# Patient Record
Sex: Female | Born: 1970 | Race: White | Hispanic: No | Smoking: Former smoker
Health system: Southern US, Community
[De-identification: ages and names within clinical notes are randomized; demographics above are authoritative.]

## PROBLEM LIST (undated history)

## (undated) DIAGNOSIS — K219 Gastro-esophageal reflux disease without esophagitis: Secondary | ICD-10-CM

## (undated) DIAGNOSIS — I429 Cardiomyopathy, unspecified: Secondary | ICD-10-CM

## (undated) DIAGNOSIS — M797 Fibromyalgia: Secondary | ICD-10-CM

## (undated) DIAGNOSIS — J449 Chronic obstructive pulmonary disease, unspecified: Secondary | ICD-10-CM

## (undated) DIAGNOSIS — G43909 Migraine, unspecified, not intractable, without status migrainosus: Secondary | ICD-10-CM

## (undated) DIAGNOSIS — M1990 Unspecified osteoarthritis, unspecified site: Secondary | ICD-10-CM

## (undated) DIAGNOSIS — C801 Malignant (primary) neoplasm, unspecified: Secondary | ICD-10-CM

## (undated) DIAGNOSIS — I10 Essential (primary) hypertension: Secondary | ICD-10-CM

## (undated) DIAGNOSIS — I340 Nonrheumatic mitral (valve) insufficiency: Secondary | ICD-10-CM

---

## 2005-04-01 ENCOUNTER — Ambulatory Visit: Payer: Self-pay | Admitting: Internal Medicine

## 2005-04-10 ENCOUNTER — Other Ambulatory Visit: Payer: Self-pay

## 2005-04-10 ENCOUNTER — Emergency Department: Payer: Self-pay | Admitting: Emergency Medicine

## 2005-05-29 ENCOUNTER — Ambulatory Visit: Payer: Self-pay | Admitting: Internal Medicine

## 2005-09-11 ENCOUNTER — Ambulatory Visit: Payer: Self-pay | Admitting: Unknown Physician Specialty

## 2005-09-15 ENCOUNTER — Ambulatory Visit: Payer: Self-pay | Admitting: Unknown Physician Specialty

## 2006-03-03 ENCOUNTER — Emergency Department: Payer: Self-pay | Admitting: Emergency Medicine

## 2016-09-15 DIAGNOSIS — R0602 Shortness of breath: Secondary | ICD-10-CM | POA: Insufficient documentation

## 2016-09-15 DIAGNOSIS — G4730 Sleep apnea, unspecified: Secondary | ICD-10-CM | POA: Insufficient documentation

## 2016-09-15 DIAGNOSIS — R079 Chest pain, unspecified: Secondary | ICD-10-CM | POA: Insufficient documentation

## 2016-09-15 DIAGNOSIS — I340 Nonrheumatic mitral (valve) insufficiency: Secondary | ICD-10-CM | POA: Insufficient documentation

## 2016-09-16 ENCOUNTER — Other Ambulatory Visit: Payer: Self-pay | Admitting: Physician Assistant

## 2016-09-16 DIAGNOSIS — R079 Chest pain, unspecified: Secondary | ICD-10-CM

## 2016-09-24 ENCOUNTER — Encounter
Admission: RE | Admit: 2016-09-24 | Discharge: 2016-09-24 | Disposition: A | Discharge status: Home / Self Care | Payer: Medicaid Other | Source: Ambulatory Visit | Attending: Physician Assistant | Admitting: Physician Assistant

## 2016-09-24 ENCOUNTER — Other Ambulatory Visit
Admission: RE | Admit: 2016-09-24 | Discharge: 2016-09-24 | Disposition: A | Discharge status: Home / Self Care | Payer: Medicaid Other | Source: Ambulatory Visit | Attending: Cardiology | Admitting: Cardiology

## 2016-09-24 ENCOUNTER — Ambulatory Visit
Admission: RE | Admit: 2016-09-24 | Discharge: 2016-09-24 | Disposition: A | Discharge status: Home / Self Care | Payer: Medicaid Other | Source: Ambulatory Visit | Attending: Cardiology | Admitting: Cardiology

## 2016-09-24 DIAGNOSIS — R079 Chest pain, unspecified: Secondary | ICD-10-CM | POA: Insufficient documentation

## 2016-09-24 DIAGNOSIS — R0602 Shortness of breath: Secondary | ICD-10-CM | POA: Insufficient documentation

## 2016-09-24 DIAGNOSIS — I083 Combined rheumatic disorders of mitral, aortic and tricuspid valves: Secondary | ICD-10-CM | POA: Diagnosis not present

## 2016-09-24 LAB — ECHOCARDIOGRAM COMPLETE
HEIGHTINCHES: 68 in
Weight: 6064 oz

## 2016-09-24 LAB — PREGNANCY, URINE: Preg Test, Ur: NEGATIVE

## 2016-09-24 MED ORDER — REGADENOSON 0.4 MG/5ML IV SOLN
0.4000 mg | Freq: Once | INTRAVENOUS | Status: AC
  Administered 2016-09-24: 0.4 mg via INTRAVENOUS

## 2016-09-24 MED ORDER — TECHNETIUM TC 99M TETROFOSMIN IV KIT
30.0000 | PACK | Freq: Once | INTRAVENOUS | Status: AC | PRN
  Administered 2016-09-24: 31.992 via INTRAVENOUS

## 2016-09-24 NOTE — Progress Notes (Signed)
*  PRELIMINARY RESULTS* Echocardiogram 2D Echocardiogram has been performed.  Paula Gilmore 09/24/2016, 11:54 AM

## 2016-09-25 ENCOUNTER — Encounter
Admission: RE | Admit: 2016-09-25 | Discharge: 2016-09-25 | Disposition: A | Discharge status: Home / Self Care | Payer: Medicaid Other | Source: Ambulatory Visit | Attending: Physician Assistant | Admitting: Physician Assistant

## 2016-09-25 DIAGNOSIS — R079 Chest pain, unspecified: Secondary | ICD-10-CM | POA: Diagnosis not present

## 2016-09-25 MED ORDER — TECHNETIUM TC 99M TETROFOSMIN IV KIT
30.0000 | PACK | Freq: Once | INTRAVENOUS | Status: AC | PRN
  Administered 2016-09-25: 30.862 via INTRAVENOUS

## 2016-11-14 LAB — NM MYOCAR MULTI W/SPECT W/WALL MOTION / EF
CHL CUP NUCLEAR SDS: 2
CHL CUP NUCLEAR SSS: 11
CHL CUP RESTING HR STRESS: 66 {beats}/min
CSEPED: 1 min
CSEPHR: 52 %
Estimated workload: 1 METS
Exercise duration (sec): 0 s
LV dias vol: 234 mL (ref 46–106)
LV sys vol: 127 mL
MPHR: 174 {beats}/min
Peak HR: 92 {beats}/min
SRS: 10
TID: 1.21

## 2016-12-17 ENCOUNTER — Other Ambulatory Visit: Payer: Self-pay | Admitting: Family Medicine

## 2016-12-17 DIAGNOSIS — R0602 Shortness of breath: Secondary | ICD-10-CM

## 2016-12-17 DIAGNOSIS — J189 Pneumonia, unspecified organism: Secondary | ICD-10-CM

## 2017-01-07 ENCOUNTER — Ambulatory Visit: Payer: Medicaid Other | Admitting: Nurse Practitioner

## 2017-01-14 ENCOUNTER — Other Ambulatory Visit: Payer: Self-pay

## 2017-01-14 ENCOUNTER — Ambulatory Visit
Admission: RE | Admit: 2017-01-14 | Discharge: 2017-01-14 | Disposition: A | Discharge status: Home / Self Care | Payer: Medicaid Other | Source: Ambulatory Visit | Attending: Family Medicine | Admitting: Family Medicine

## 2017-01-14 ENCOUNTER — Ambulatory Visit: Payer: Medicaid Other | Attending: Nurse Practitioner | Admitting: Nurse Practitioner

## 2017-01-14 ENCOUNTER — Encounter: Payer: Self-pay | Admitting: Nurse Practitioner

## 2017-01-14 ENCOUNTER — Other Ambulatory Visit
Admission: RE | Admit: 2017-01-14 | Discharge: 2017-01-14 | Disposition: A | Discharge status: Home / Self Care | Payer: Medicaid Other | Source: Ambulatory Visit | Attending: Nurse Practitioner | Admitting: Nurse Practitioner

## 2017-01-14 ENCOUNTER — Ambulatory Visit
Admission: RE | Admit: 2017-01-14 | Discharge: 2017-01-14 | Disposition: A | Discharge status: Home / Self Care | Payer: Medicaid Other | Source: Ambulatory Visit | Attending: Nurse Practitioner | Admitting: Nurse Practitioner

## 2017-01-14 VITALS — BP 157/112 | HR 88 | Resp 16 | Ht 68.0 in | Wt 362.0 lb

## 2017-01-14 DIAGNOSIS — M25561 Pain in right knee: Secondary | ICD-10-CM

## 2017-01-14 DIAGNOSIS — R0602 Shortness of breath: Secondary | ICD-10-CM

## 2017-01-14 DIAGNOSIS — M25512 Pain in left shoulder: Secondary | ICD-10-CM | POA: Insufficient documentation

## 2017-01-14 DIAGNOSIS — M25562 Pain in left knee: Secondary | ICD-10-CM | POA: Insufficient documentation

## 2017-01-14 DIAGNOSIS — M5441 Lumbago with sciatica, right side: Secondary | ICD-10-CM

## 2017-01-14 DIAGNOSIS — R918 Other nonspecific abnormal finding of lung field: Secondary | ICD-10-CM | POA: Insufficient documentation

## 2017-01-14 DIAGNOSIS — M79603 Pain in arm, unspecified: Secondary | ICD-10-CM | POA: Insufficient documentation

## 2017-01-14 DIAGNOSIS — G894 Chronic pain syndrome: Secondary | ICD-10-CM | POA: Insufficient documentation

## 2017-01-14 DIAGNOSIS — M797 Fibromyalgia: Secondary | ICD-10-CM | POA: Insufficient documentation

## 2017-01-14 DIAGNOSIS — G8929 Other chronic pain: Secondary | ICD-10-CM | POA: Insufficient documentation

## 2017-01-14 DIAGNOSIS — M542 Cervicalgia: Secondary | ICD-10-CM | POA: Insufficient documentation

## 2017-01-14 DIAGNOSIS — Z789 Other specified health status: Secondary | ICD-10-CM | POA: Insufficient documentation

## 2017-01-14 DIAGNOSIS — J189 Pneumonia, unspecified organism: Secondary | ICD-10-CM | POA: Insufficient documentation

## 2017-01-14 DIAGNOSIS — M79604 Pain in right leg: Secondary | ICD-10-CM | POA: Insufficient documentation

## 2017-01-14 DIAGNOSIS — R51 Headache: Secondary | ICD-10-CM

## 2017-01-14 DIAGNOSIS — M79602 Pain in left arm: Secondary | ICD-10-CM | POA: Insufficient documentation

## 2017-01-14 DIAGNOSIS — M25511 Pain in right shoulder: Secondary | ICD-10-CM | POA: Insufficient documentation

## 2017-01-14 DIAGNOSIS — M546 Pain in thoracic spine: Secondary | ICD-10-CM | POA: Diagnosis not present

## 2017-01-14 DIAGNOSIS — M79606 Pain in leg, unspecified: Secondary | ICD-10-CM | POA: Insufficient documentation

## 2017-01-14 DIAGNOSIS — M779 Enthesopathy, unspecified: Secondary | ICD-10-CM | POA: Diagnosis not present

## 2017-01-14 DIAGNOSIS — M545 Low back pain, unspecified: Secondary | ICD-10-CM | POA: Insufficient documentation

## 2017-01-14 DIAGNOSIS — Z79899 Other long term (current) drug therapy: Secondary | ICD-10-CM | POA: Diagnosis not present

## 2017-01-14 DIAGNOSIS — M79605 Pain in left leg: Secondary | ICD-10-CM | POA: Diagnosis not present

## 2017-01-14 DIAGNOSIS — R519 Headache, unspecified: Secondary | ICD-10-CM | POA: Insufficient documentation

## 2017-01-14 DIAGNOSIS — M79601 Pain in right arm: Secondary | ICD-10-CM | POA: Diagnosis not present

## 2017-01-14 DIAGNOSIS — M5442 Lumbago with sciatica, left side: Secondary | ICD-10-CM

## 2017-01-14 DIAGNOSIS — M899 Disorder of bone, unspecified: Secondary | ICD-10-CM

## 2017-01-14 DIAGNOSIS — M170 Bilateral primary osteoarthritis of knee: Secondary | ICD-10-CM | POA: Diagnosis not present

## 2017-01-14 DIAGNOSIS — R609 Edema, unspecified: Secondary | ICD-10-CM | POA: Insufficient documentation

## 2017-01-14 LAB — COMPREHENSIVE METABOLIC PANEL
ALBUMIN: 4.3 g/dL (ref 3.5–5.0)
ALT: 14 U/L (ref 14–54)
ANION GAP: 9 (ref 5–15)
AST: 16 U/L (ref 15–41)
Alkaline Phosphatase: 71 U/L (ref 38–126)
BILIRUBIN TOTAL: 0.6 mg/dL (ref 0.3–1.2)
BUN: 18 mg/dL (ref 6–20)
CO2: 23 mmol/L (ref 22–32)
Calcium: 9.5 mg/dL (ref 8.9–10.3)
Chloride: 105 mmol/L (ref 101–111)
Creatinine, Ser: 0.82 mg/dL (ref 0.44–1.00)
GFR calc Af Amer: >60 mL/min (ref >60)
GLUCOSE: 101 mg/dL — AB (ref 65–99)
POTASSIUM: 4 mmol/L (ref 3.5–5.1)
Sodium: 137 mmol/L (ref 135–145)
TOTAL PROTEIN: 7.8 g/dL (ref 6.5–8.1)

## 2017-01-14 LAB — VITAMIN B12: VITAMIN B 12: 521 pg/mL (ref 180–914)

## 2017-01-14 LAB — SEDIMENTATION RATE: SED RATE: 10 mm/h (ref 0–20)

## 2017-01-14 LAB — MAGNESIUM: Magnesium: 2 mg/dL (ref 1.7–2.4)

## 2017-01-14 LAB — C-REACTIVE PROTEIN: CRP: 1.2 mg/dL — ABNORMAL HIGH (ref <1.0)

## 2017-01-14 NOTE — Progress Notes (Signed)
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    ____________________________________________________________________________________________  Appointment Policy Summary  It is our goal and responsibility to provide the medical community with assistance in the evaluation and management of patients with chronic pain. Unfortunately our resources are limited. Because we do not have an unlimited amount of time, or available appointments, we are required to closely monitor and manage their use. The following rules exist to maximize their use:  Patient's responsibilities: 1. Punctuality:  At what time should I arrive? You should be physically present in our office 30 minutes before your scheduled appointment. Your scheduled appointment is with your assigned healthcare provider. However, it takes 5-10 minutes to be "checked-in", and another 15 minutes for the nurses to do the admission. If you arrive to our office at the time you were given for your appointment, you will end up being at least 20-25 minutes late to your appointment with the provider. 2. Tardiness:  What happens if I arrive only a few minutes after my scheduled appointment  time? You will need to reschedule your appointment. The cutoff is your appointment time. This is why it is so important that you arrive at least 30 minutes before that appointment. If you have an appointment scheduled for 10:00 AM and you arrive at 10:01, you will be required to reschedule your appointment.  3. Plan ahead:  Always assume that you will encounter traffic on your way in. Plan for it. If you are dependent on a driver, make sure they understand these rules and the need to arrive early. 4. Other appointments and responsibilities:  Avoid scheduling any other appointments before or after your pain clinic appointments.  5. Be prepared:  Write down everything that you need to discuss with your healthcare provider and give this information to the admitting nurse. Write down the medications that you will need refilled. Bring your pills and bottles (even the empty ones), to all of your appointments, except for those where a procedure is scheduled. 6. No children or pets:  Find someone to take care of them. It is not appropriate to bring them in. 7. Scheduling  changes:  We request "advanced notification" of any changes or cancellations. 8. Advanced notification:  Defined as a time period of more than 24 hours prior to the originally scheduled appointment. This allows for the appointment to be offered to other patients. 9. Rescheduling:  When a visit is rescheduled, it will require the cancellation of the original appointment. For this reason they both fall within the category of "Cancellations".  10. Cancellations:  They require advanced notification. Any cancellation less than 24 hours before the  appointment will be recorded as a "No Show". 11. No Show:  Defined as an unkept appointment where the patient failed to notify or declare to the practice their intention or inability to keep the appointment.  Corrective process for repeat offenders:  1. Tardiness: Three (3) episodes of rescheduling  due to late arrivals will be recorded as one (1) "No Show". 2. Cancellation or reschedule: Three (3) cancellations or rescheduling will be recorded as one (1) "No Show". 3. "No Shows": Three (3) "No Shows" within a 12 month period will result in discharge from the practice.  ____________________________________________________________________________________________   ____________________________________________________________________________________________  Pain Scale  Introduction: The pain score used by this practice is the Verbal Numerical Rating Scale (VNRS-11). This is an 11-point scale. It is for adults and children 10 years or older. There are significant differences in how the pain score is reported, used, and applied. Forget everything you learned in the past and learn this scoring system.  General Information: The scale should reflect your current level of pain. Unless you are specifically asked for the level of your worst pain, or your average pain. If you are asked for one of these two, then it should be understood that it is over the past 24 hours.  Basic Activities of Daily Living (ADL): Personal hygiene, dressing, eating, transferring, and using restroom.  Instructions: Most patients tend to report their level of pain as a combination of two factors, their physical pain and their psychosocial pain. This last one is also known as "suffering" and it is reflection of how physical pain affects you socially and psychologically. From now on, report them separately. From this point on, when asked to report your pain level, report only your physical pain. Use the following table for reference.  Pain Clinic Pain Levels (0-5/10)  Pain Level Score  Description  No Pain 0   Mild pain 1 Nagging, annoying, but does not interfere with basic activities of daily living (ADL). Patients are able to eat, bathe, get dressed, toileting (being able to get on and off the toilet and perform personal  hygiene functions), transfer (move in and out of bed or a chair without assistance), and maintain continence (able to control bladder and bowel functions). Blood pressure and heart rate are unaffected. A normal heart rate for a healthy adult ranges from 60 to 100 bpm (beats per minute).   Mild to moderate pain 2 Noticeable and distracting. Impossible to hide from other people. More frequent flare-ups. Still possible to adapt and function close to normal. It can be very annoying and may have occasional stronger flare-ups. With discipline, patients may get used to it and adapt.   Moderate pain 3 Interferes significantly with activities of daily living (ADL). It becomes difficult to feed, bathe, get dressed, get on and off the toilet or to perform personal hygiene functions. Difficult to get in and out of bed or a chair without assistance. Very distracting. With effort, it can be ignored when deeply involved in  activities.   Moderately severe pain 4 Impossible to ignore for more than a few minutes. With effort, patients may still be able to manage work or participate in some social activities. Very difficult to concentrate. Signs of autonomic nervous system discharge are evident: dilated pupils (mydriasis); mild sweating (diaphoresis); sleep interference. Heart rate becomes elevated (>115 bpm). Diastolic blood pressure (lower number) rises above 100 mmHg. Patients find relief in laying down and not moving.   Severe pain 5 Intense and extremely unpleasant. Associated with frowning face and frequent crying. Pain overwhelms the senses.  Ability to do any activity or maintain social relationships becomes significantly limited. Conversation becomes difficult. Pacing back and forth is common, as getting into a comfortable position is nearly impossible. Pain wakes you up from deep sleep. Physical signs will be obvious: pupillary dilation; increased sweating; goosebumps; brisk reflexes; cold, clammy hands and feet;  nausea, vomiting or dry heaves; loss of appetite; significant sleep disturbance with inability to fall asleep or to remain asleep. When persistent, significant weight loss is observed due to the complete loss of appetite and sleep deprivation.  Blood pressure and heart rate becomes significantly elevated. Caution: If elevated blood pressure triggers a pounding headache associated with blurred vision, then the patient should immediately seek attention at an urgent or emergency care unit, as these may be signs of an impending stroke.    Emergency Department Pain Levels (6-10/10)  Emergency Room Pain 6 Severely limiting. Requires emergency care and should not be seen or managed at an outpatient pain management facility. Communication becomes difficult and requires great effort. Assistance to reach the emergency department may be required. Facial flushing and profuse sweating along with potentially dangerous increases in heart rate and blood pressure will be evident.   Distressing pain 7 Self-care is very difficult. Assistance is required to transport, or use restroom. Assistance to reach the emergency department will be required. Tasks requiring coordination, such as bathing and getting dressed become very difficult.   Disabling pain 8 Self-care is no longer possible. At this level, pain is disabling. The individual is unable to do even the most "basic" activities such as walking, eating, bathing, dressing, transferring to a bed, or toileting. Fine motor skills are lost. It is difficult to think clearly.   Incapacitating pain 9 Pain becomes incapacitating. Thought processing is no longer possible. Difficult to remember your own name. Control of movement and coordination are lost.   The worst pain imaginable 10 At this level, most patients pass out from pain. When this level is reached, collapse of the autonomic nervous system occurs, leading to a sudden drop in blood pressure and heart rate. This in turn  results in a temporary and dramatic drop in blood flow to the brain, leading to a loss of consciousness. Fainting is one of the body's self defense mechanisms. Passing out puts the brain in a calmed state and causes it to shut down for a while, in order to begin the healing process.    Summary: 1. Refer to this scale when providing [place] with your pain level. 2. Be accurate and careful when reporting your pain level. This will help with your care. 3. Over-reporting your pain level will lead to loss of credibility. 4. Even a level of 1/10 means that there is pain and will be treated at our facility. 5. High, inaccurate reporting will be documented as "Symptom Exaggeration", leading to loss of credibility and suspicions of possible secondary gains such as obtaining more narcotics, or wanting  to appear disabled, for fraudulent reasons. 6. Only pain levels of 5 or below will be seen at our facility. 7. Pain levels of 6 and above will be sent to the Emergency Department and the appointment cancelled. ____________________________________________________________________________________________

## 2017-01-14 NOTE — Patient Instructions (Signed)

## 2017-01-14 NOTE — Progress Notes (Signed)
Safety precautions to be maintained throughout the outpatient stay will include: orient to surroundings, keep bed in low position, maintain call bell within reach at all times, provide assistance with transfer out of bed and ambulation.  

## 2017-01-15 LAB — VITAMIN D 25 HYDROXY (VIT D DEFICIENCY, FRACTURES): VIT D 25 HYDROXY: 13.2 ng/mL — AB (ref 30.0–100.0)

## 2017-01-22 LAB — COMPLIANCE DRUG ANALYSIS, UR

## 2017-01-23 ENCOUNTER — Other Ambulatory Visit: Payer: Self-pay

## 2017-01-28 NOTE — Progress Notes (Signed)
Results were reviewed and found to be: mildly abnormal  No acute injury or pathology identified  Review would suggest interventional pain management techniques may be of benefit 

## 2017-01-28 NOTE — Progress Notes (Signed)
Results were reviewed and found to be: abnormal  Further testing may be useful  Review would suggest interventional pain management techniques may be of benefit

## 2017-08-21 ENCOUNTER — Other Ambulatory Visit: Payer: Self-pay | Admitting: Obstetrics & Gynecology

## 2017-08-21 DIAGNOSIS — R102 Pelvic and perineal pain: Secondary | ICD-10-CM

## 2017-08-21 DIAGNOSIS — N23 Unspecified renal colic: Secondary | ICD-10-CM

## 2017-09-02 ENCOUNTER — Ambulatory Visit: Admission: RE | Admit: 2017-09-02 | Payer: Medicaid Other | Source: Ambulatory Visit

## 2018-10-08 ENCOUNTER — Emergency Department: Payer: Medicaid Other

## 2018-10-08 ENCOUNTER — Emergency Department
Admission: EM | Admit: 2018-10-08 | Discharge: 2018-10-08 | Disposition: A | Discharge status: Home / Self Care | Payer: Medicaid Other | Attending: Emergency Medicine | Admitting: Emergency Medicine

## 2018-10-08 ENCOUNTER — Other Ambulatory Visit: Payer: Self-pay

## 2018-10-08 DIAGNOSIS — F17200 Nicotine dependence, unspecified, uncomplicated: Secondary | ICD-10-CM | POA: Diagnosis not present

## 2018-10-08 DIAGNOSIS — R05 Cough: Secondary | ICD-10-CM | POA: Diagnosis present

## 2018-10-08 DIAGNOSIS — Z79899 Other long term (current) drug therapy: Secondary | ICD-10-CM | POA: Diagnosis not present

## 2018-10-08 DIAGNOSIS — I10 Essential (primary) hypertension: Secondary | ICD-10-CM | POA: Insufficient documentation

## 2018-10-08 DIAGNOSIS — J449 Chronic obstructive pulmonary disease, unspecified: Secondary | ICD-10-CM | POA: Diagnosis not present

## 2018-10-08 DIAGNOSIS — F410 Panic disorder [episodic paroxysmal anxiety] without agoraphobia: Secondary | ICD-10-CM | POA: Insufficient documentation

## 2018-10-08 DIAGNOSIS — R110 Nausea: Secondary | ICD-10-CM

## 2018-10-08 DIAGNOSIS — J069 Acute upper respiratory infection, unspecified: Secondary | ICD-10-CM | POA: Insufficient documentation

## 2018-10-08 DIAGNOSIS — Z20828 Contact with and (suspected) exposure to other viral communicable diseases: Secondary | ICD-10-CM | POA: Insufficient documentation

## 2018-10-08 HISTORY — DX: Essential (primary) hypertension: I10

## 2018-10-08 LAB — GLUCOSE, CAPILLARY: Glucose-Capillary: 130 mg/dL — ABNORMAL HIGH (ref 70–99)

## 2018-10-08 LAB — URINALYSIS, COMPLETE (UACMP) WITH MICROSCOPIC
Bacteria, UA: NONE SEEN
Bilirubin Urine: NEGATIVE
Glucose, UA: NEGATIVE mg/dL
Ketones, ur: NEGATIVE mg/dL
Leukocytes,Ua: NEGATIVE
Nitrite: NEGATIVE
Protein, ur: NEGATIVE mg/dL
RBC / HPF: >50 RBC/hpf — ABNORMAL HIGH (ref 0–5)
Specific Gravity, Urine: 1.014 (ref 1.005–1.030)
pH: 5 (ref 5.0–8.0)

## 2018-10-08 LAB — CBC
HCT: 44.5 % (ref 36.0–46.0)
Hemoglobin: 14.6 g/dL (ref 12.0–15.0)
MCH: 28.9 pg (ref 26.0–34.0)
MCHC: 32.8 g/dL (ref 30.0–36.0)
MCV: 88.1 fL (ref 80.0–100.0)
Platelets: 273 10*3/uL (ref 150–400)
RBC: 5.05 MIL/uL (ref 3.87–5.11)
RDW: 13.9 % (ref 11.5–15.5)
WBC: 12.9 10*3/uL — ABNORMAL HIGH (ref 4.0–10.5)
nRBC: 0 % (ref 0.0–0.2)

## 2018-10-08 LAB — BASIC METABOLIC PANEL
Anion gap: 11 (ref 5–15)
BUN: 13 mg/dL (ref 6–20)
CO2: 23 mmol/L (ref 22–32)
Calcium: 10.1 mg/dL (ref 8.9–10.3)
Chloride: 102 mmol/L (ref 98–111)
Creatinine, Ser: 0.58 mg/dL (ref 0.44–1.00)
GFR calc Af Amer: >60 mL/min (ref >60)
GFR calc non Af Amer: >60 mL/min (ref >60)
Glucose, Bld: 129 mg/dL — ABNORMAL HIGH (ref 70–99)
Potassium: 3.7 mmol/L (ref 3.5–5.1)
Sodium: 136 mmol/L (ref 135–145)

## 2018-10-08 LAB — POCT PREGNANCY, URINE: Preg Test, Ur: NEGATIVE

## 2018-10-08 MED ORDER — ONDANSETRON 4 MG PO TBDP
ORAL_TABLET | ORAL | Status: DC
Start: 2018-10-08 — End: 2018-10-08
  Filled 2018-10-08: qty 1

## 2018-10-08 MED ORDER — ONDANSETRON 4 MG PO TBDP
4.0000 mg | ORAL_TABLET | Freq: Once | ORAL | Status: AC
  Administered 2018-10-08: 18:00:00 4 mg via ORAL

## 2018-10-08 MED ORDER — PREDNISONE 20 MG PO TABS: 40.0000 mg | ORAL_TABLET | Freq: Every day | ORAL | 0 refills | Status: DC

## 2018-10-08 MED ORDER — ONDANSETRON HCL 4 MG/2ML IJ SOLN: 4.0000 mg | Freq: Once | INTRAMUSCULAR | Status: DC

## 2018-10-08 MED ORDER — PREDNISONE 20 MG PO TABS
40.0000 mg | ORAL_TABLET | Freq: Once | ORAL | Status: AC
  Administered 2018-10-08: 40 mg via ORAL
  Filled 2018-10-08: qty 2

## 2018-10-08 MED ORDER — ONDANSETRON 4 MG PO TBDP: 4.0000 mg | ORAL_TABLET | Freq: Four times a day (QID) | ORAL | 0 refills | Status: DC | PRN

## 2018-10-08 MED ORDER — ALUM & MAG HYDROXIDE-SIMETH 200-200-20 MG/5ML PO SUSP
15.0000 mL | Freq: Once | ORAL | Status: AC
  Administered 2018-10-08: 15 mL via ORAL
  Filled 2018-10-08: qty 30

## 2018-10-08 NOTE — ED Notes (Signed)
Pt refuses discharge vitals due to needing to leave

## 2018-10-08 NOTE — ED Notes (Addendum)
Pt frantic in lobby about a "ride to get to her appointments". Pt making phone calls and sobbing. Pt stating she cannot breath. Oxygen stats at 100%. Stating driver called her "a killer." Son attempting to console pt.

## 2018-10-08 NOTE — ED Provider Notes (Signed)
[facility] Emergency Department Provider Note   ____________________________________________   First MD Initiated Contact with Patient 10/08/18 1657     (approximate)  I have reviewed the triage vital signs and the nursing notes.   HISTORY  Chief Complaint Nausea and Dizziness    HPI Paula Gilmore is a 48 y.o. female is a history of recently diagnosed COPD  Patient reports she has been having a cough that started initially with nasal congestion a few weeks ago, she is also had a dry nonproductive cough for a couple weeks.  She does have COPD and was treated with steroids about 10 days ago, she took for a few days seem to get better except she is had an ongoing cough.  She reports she called her doctor Dr. Raul Del today, he advised her to come for a COVID test.  She reports that she got on the transport van and will in route here she start developing nausea, felt her stomach was upset, then started to feel hot, she got here and was not feeling too good, reports she had a "panic attack" and came to the emergency room to be evaluated because she could not go to the doctor's office for her test at that point  She continues to endorse an ongoing dry cough.  Occasionally using her inhaler.  She states she will use it once or twice a day.  No fevers or chills.  Some nausea but no vomiting.  Reports her stomach felt very upset like she developed "gastritis" but starting to feel better now   Past Medical History:  Diagnosis Date  . Hypertension     Patient Active Problem List   Diagnosis Date Noted  . Headache (Primary Area of Pain) 01/14/2017  . Chronic low back pain (Secondary Area of Pain) (B) (R>L) 01/14/2017  . Chronic pain of lower extremity [facility] Area of Pain) (Bilateral) (R>L) 01/14/2017  . Bilateral chronic knee pain (Fourth Area of Pain) (Bilateral) (R>L) 01/14/2017  . Chronic upper extremity pain 01/14/2017  . Chronic shoulder pain (B) (R>L)  01/14/2017  . Chronic neck pain (midline) 01/14/2017  . Disorder of bone, unspecified 01/14/2017  . Other long term (current) drug therapy 01/14/2017  . Other specified health status 01/14/2017  . Chronic pain syndrome 01/14/2017  . Chest pain with high risk for cardiac etiology 09/15/2016  . Mitral valve regurgitation 09/15/2016  . Shortness of breath 09/15/2016  . Sleep apnea 09/15/2016    History reviewed. No pertinent surgical history.  Prior to Admission medications   Medication Sig Start Date End Date Taking? Authorizing Provider  acetaminophen (TYLENOL) 325 MG tablet Take 1,000 mg by mouth every 6 (six) hours.    [provider]  Aspirin-Acetaminophen-Caffeine (GOODYS EXTRA STRENGTH) [id number] MG PACK Take 1 packet by mouth as needed.    [provider]  hydrochlorothiazide (HYDRODIURIL) 25 MG tablet Take 25 mg by mouth daily.    [provider]  ibuprofen (ADVIL,MOTRIN) 200 MG tablet Take 800 mg by mouth every 6 (six) hours.    [provider]  loperamide (IMODIUM) 2 MG capsule Take 2 mg by mouth as needed.    [provider]  methocarbamol (ROBAXIN) 750 MG tablet Take 750 mg by mouth 4 (four) times daily.    [provider]  naproxen (NAPROSYN) 250 MG tablet Take 250 mg by mouth 2 (two) times daily.    [provider]  ondansetron (ZOFRAN ODT) 4 MG disintegrating tablet Take 1 tablet (4  mg total) by mouth every 6 (six) hours as needed for nausea or vomiting. 10/08/18   Delman Kitten, MD  predniSONE (DELTASONE) 20 MG tablet Take 2 tablets (40 mg total) by mouth daily with breakfast. 10/08/18   Delman Kitten, MD  SUCRALFATE PO Take 1 tablet by mouth 4 (four) times daily.    [provider]    Allergies Cymbalta [duloxetine hcl], Montelukast, Tizanidine, and Savella [milnacipran hcl]  No family history on file.  Social History Social History   Tobacco Use  . Smoking status: Current Some Day Smoker  .  Smokeless tobacco: Never Used  Substance Use Topics  . Alcohol use: No    Frequency: Never  . Drug use: No    Review of Systems Constitutional: No fever/chills Eyes: No visual changes. ENT: No sore throat. Cardiovascular: Denies chest pain. Respiratory: Denies shortness of breath.  See HPI, dry cough Gastrointestinal: No abdominal pain.   Genitourinary: Negative for dysuria.  Denies pregnancy.  Reports her IUD fell out recently and she is going to follow-up with the doctor on that Musculoskeletal: Negative for back pain. Skin: Negative for rash. Neurological: Negative for headaches, areas of focal weakness or numbness.    ____________________________________________   PHYSICAL EXAM:  VITAL SIGNS: ED Triage Vitals  Enc Vitals Group     BP 10/08/18 1308 (!) 146/84     Pulse Rate 10/08/18 1308 88     Resp 10/08/18 1308 18     Temp 10/08/18 1308 98.1 F (36.7 C)     Temp Source 10/08/18 1308 Oral     SpO2 10/08/18 1308 94 %     Weight 10/08/18 1308 (!) 350 lb (158.8 kg)     Height 10/08/18 1308 5\' 8"  (1.727 m)     Head Circumference --      Peak Flow --      Pain Score 10/08/18 1311 7     Pain Loc --      Pain Edu? --      Excl. in [place]? --     Constitutional: Alert and oriented. Well appearing and in no acute distress.  Patient has morbid obesity.   Eyes: Conjunctivae are normal. Head: Atraumatic. Nose: Slight clear rhinorrhea Mouth/Throat: Mucous membranes are moist. Neck: No stridor.  Cardiovascular: Normal rate, regular rhythm. Grossly normal heart sounds.  Good peripheral circulation. Respiratory: Normal respiratory effort.  No retractions. Lungs CTAB.  Patient exhibits an occasional dry cough. Gastrointestinal: Soft and nontender. No distention. Musculoskeletal: No lower extremity tenderness nor edema. Neurologic:  Normal speech and language. No gross focal neurologic deficits are appreciated.  Skin:  Skin is warm, dry and intact. No rash noted. Psychiatric:  Mood and affect are normal. Speech and behavior are normal.  ____________________________________________   LABS (all labs ordered are listed, but only abnormal results are displayed)  Labs Reviewed  BASIC METABOLIC PANEL - Abnormal; Notable for the following components:      Result Value   Glucose, Bld 129 (*)    All other components within normal limits  CBC - Abnormal; Notable for the following components:   WBC 12.9 (*)    All other components within normal limits  URINALYSIS, COMPLETE (UACMP) WITH MICROSCOPIC - Abnormal; Notable for the following components:   Color, Urine YELLOW (*)    APPearance CLEAR (*)    Hgb urine dipstick LARGE (*)    RBC / HPF >50 (*)    All other components within normal limits  GLUCOSE, CAPILLARY - Abnormal; Notable  for the following components:   Glucose-Capillary 130 (*)    All other components within normal limits  SARS CORONAVIRUS 2 (TAT 6-24 HRS)  CBG MONITORING, ED  POCT PREGNANCY, URINE   ____________________________________________  EKG  Reviewed interpreted at 1320 Heart rate 99 QRS 99 QTc 440 Normal sinus rhythm no evidence of ischemia or ectopy ____________________________________________  RADIOLOGY  Dg Chest Portable 1 View  Result Date: 10/08/2018 CLINICAL DATA:  Cough. EXAM: PORTABLE CHEST 1 VIEW COMPARISON:  01/14/2017 FINDINGS: The heart size and mediastinal contours are within normal limits. Both lungs are clear except for minimal peribronchial thickening. The visualized skeletal structures are unremarkable. IMPRESSION: Minimal bronchitic changes. Electronically Signed   By: Lorriane Shire M.D.   On: 10/08/2018 17:48    Imaging negative for acute infiltrate. ____________________________________________   PROCEDURES  Procedure(s) performed: None  Procedures  Critical Care performed: No  ____________________________________________   INITIAL IMPRESSION / ASSESSMENT AND PLAN / ED COURSE  Pertinent labs &  imaging results that were available during my care of the patient were reviewed by me and considered in my medical decision making (see chart for details).   Patient coming for evaluation of a persistent cough.  Treated with antibiotic and prednisone couple weeks ago.  She reports ongoing symptoms of this.  Overall appears well, nontoxic with reassuring vital signs and oxygen saturation.  Her normal work of breathing.  Does have present dry cough.  Overall she appears well, but does appear to have some type of lingering bronchitis, based on her clinical history upper respiratory infection is highly suspected, will send a COVID test here, also she started reporting nausea stomach upset and abdominal symptoms while in the [place] here, this is slowly improving, question if this could have represented some type of motion sickness, or potentially gastritis.  She has a very reassuring abdominal exam without any evidence of peritonitis.  Nontoxic, reassuring examination.  We will follow-up on chest x-ray to evaluate for pneumonia.  No chest pain.  Reassuring EKG.  No signs or symptoms of acute cardiovascular disease.  No neurologic symptoms.  Additionally, we will set COVID test, patient agreeable with this plan, and trial medications for nausea and stomach upset which I suspect are likely due to some type of motion sickness as there is resolving now.     ----------------------------------------- 7:28 PM on 10/08/2018 -----------------------------------------  Patient comfortable with plan for discharge.  Reviewed x-ray, no indication for antibiotic therapy at this time.  Recently completed antibiotic and there is no evidence of infiltrate.  Work of breathing is normal, normal oxygen saturation.  Discussed with patient her pending COVID test, she will home isolate pending test results which should be back in about 24 hours.  Prescription for prednisone for cough and bronchitis given as well as antiemetic should  nausea recur.  She is currently feeling well.  Brother driving her home  Return precautions and treatment recommendations and follow-up discussed with the patient who is agreeable with the plan.  Dareli Izzard was evaluated in Emergency Department on 10/08/2018 for the symptoms described in the history of present illness. She was evaluated in the context of the global COVID-19 pandemic, which necessitated consideration that the patient might be at risk for infection with the SARS-CoV-2 virus that causes COVID-19. Institutional protocols and algorithms that pertain to the evaluation of patients at risk for COVID-19 are in a state of rapid change based on information released by regulatory bodies including the CDC and federal and state organizations. These  policies and algorithms were followed during the patient's care in the ED.  ____________________________________________   FINAL CLINICAL IMPRESSION(S) / ED DIAGNOSES  Final diagnoses:  Viral upper respiratory tract infection  Nausea        Note:  This document was prepared using Dragon voice recognition software and may include unintentional dictation errors       Delman Kitten, MD 10/08/18 1929

## 2018-10-08 NOTE — ED Notes (Signed)
Pt sitting in bed speaking with this RN in NAD, pt reports she thinks she had a panic attack while waiting in the lobby due to a verbal altercation with transportation. PT reports "I think I have gastritis, I have acid all the way up in my throat".

## 2018-10-08 NOTE — ED Triage Notes (Addendum)
Reports sudden onset of feeling lightheaded, diaphoresis, hot and nauseated while driving to doctors appt. Pt tearful, anxious. Pt was going to see doctor due to bronchitis. Alert and oriented X4, pt in NAD. RR even and unlabored. Denies CP or SOB. Pt diaphoretic.

## 2018-10-09 LAB — SARS CORONAVIRUS 2 (TAT 6-24 HRS): SARS Coronavirus 2: NEGATIVE

## 2018-11-26 ENCOUNTER — Other Ambulatory Visit: Payer: Self-pay

## 2018-11-26 ENCOUNTER — Emergency Department
Admission: EM | Admit: 2018-11-26 | Discharge: 2018-11-26 | Disposition: A | Discharge status: Home / Self Care | Payer: Medicaid Other | Attending: Emergency Medicine | Admitting: Emergency Medicine

## 2018-11-26 ENCOUNTER — Encounter: Payer: Self-pay | Admitting: Emergency Medicine

## 2018-11-26 ENCOUNTER — Emergency Department: Payer: Medicaid Other

## 2018-11-26 DIAGNOSIS — Z79899 Other long term (current) drug therapy: Secondary | ICD-10-CM | POA: Insufficient documentation

## 2018-11-26 DIAGNOSIS — F17210 Nicotine dependence, cigarettes, uncomplicated: Secondary | ICD-10-CM | POA: Insufficient documentation

## 2018-11-26 DIAGNOSIS — J40 Bronchitis, not specified as acute or chronic: Secondary | ICD-10-CM | POA: Insufficient documentation

## 2018-11-26 DIAGNOSIS — I10 Essential (primary) hypertension: Secondary | ICD-10-CM | POA: Diagnosis not present

## 2018-11-26 DIAGNOSIS — J449 Chronic obstructive pulmonary disease, unspecified: Secondary | ICD-10-CM | POA: Diagnosis not present

## 2018-11-26 DIAGNOSIS — Z20828 Contact with and (suspected) exposure to other viral communicable diseases: Secondary | ICD-10-CM | POA: Diagnosis not present

## 2018-11-26 DIAGNOSIS — R0602 Shortness of breath: Secondary | ICD-10-CM | POA: Diagnosis present

## 2018-11-26 LAB — CBC
HCT: 41.2 % (ref 36.0–46.0)
Hemoglobin: 13.9 g/dL (ref 12.0–15.0)
MCH: 29.4 pg (ref 26.0–34.0)
MCHC: 33.7 g/dL (ref 30.0–36.0)
MCV: 87.1 fL (ref 80.0–100.0)
Platelets: 269 10*3/uL (ref 150–400)
RBC: 4.73 MIL/uL (ref 3.87–5.11)
RDW: 13.2 % (ref 11.5–15.5)
WBC: 10.5 10*3/uL (ref 4.0–10.5)
nRBC: 0 % (ref 0.0–0.2)

## 2018-11-26 LAB — BRAIN NATRIURETIC PEPTIDE: B Natriuretic Peptide: 14 pg/mL (ref 0.0–100.0)

## 2018-11-26 LAB — BASIC METABOLIC PANEL
Anion gap: 11 (ref 5–15)
BUN: 12 mg/dL (ref 6–20)
CO2: 22 mmol/L (ref 22–32)
Calcium: 10.2 mg/dL (ref 8.9–10.3)
Chloride: 103 mmol/L (ref 98–111)
Creatinine, Ser: 0.65 mg/dL (ref 0.44–1.00)
GFR calc Af Amer: >60 mL/min (ref >60)
GFR calc non Af Amer: >60 mL/min (ref >60)
Glucose, Bld: 123 mg/dL — ABNORMAL HIGH (ref 70–99)
Potassium: 3.7 mmol/L (ref 3.5–5.1)
Sodium: 136 mmol/L (ref 135–145)

## 2018-11-26 MED ORDER — METHYLPREDNISOLONE SODIUM SUCC 125 MG IJ SOLR
125.0000 mg | Freq: Once | INTRAMUSCULAR | Status: AC
  Administered 2018-11-26: 125 mg via INTRAVENOUS

## 2018-11-26 MED ORDER — AZITHROMYCIN 500 MG PO TABS
500.0000 mg | ORAL_TABLET | Freq: Once | ORAL | Status: AC
  Administered 2018-11-26: 500 mg via ORAL
  Filled 2018-11-26: qty 1

## 2018-11-26 MED ORDER — METHYLPREDNISOLONE SODIUM SUCC 125 MG IJ SOLR
125.0000 mg | Freq: Once | INTRAMUSCULAR | Status: DC
  Filled 2018-11-26: qty 2

## 2018-11-26 MED ORDER — PREDNISONE 50 MG PO TABS: ORAL_TABLET | ORAL | 0 refills | Status: DC

## 2018-11-26 MED ORDER — IPRATROPIUM-ALBUTEROL 0.5-2.5 (3) MG/3ML IN SOLN
3.0000 mL | Freq: Once | RESPIRATORY_TRACT | Status: AC
  Administered 2018-11-26: 3 mL via RESPIRATORY_TRACT
  Filled 2018-11-26: qty 3

## 2018-11-26 MED ORDER — AZITHROMYCIN 250 MG PO TABS: ORAL_TABLET | ORAL | 0 refills | Status: DC

## 2018-11-26 NOTE — ED Notes (Signed)
See triage note  Presents with SOB and sinus congestion  States she just finished antibiotics and steroids   States she gets sinus infection couple times a year and it takes 2 doses of meds  afebrile

## 2018-11-26 NOTE — ED Provider Notes (Signed)
[facility] Emergency Department Provider Note  ____________________________________________  Time seen: Approximately 9:17 PM  I have reviewed the triage vital signs and the nursing notes.   HISTORY  Chief Complaint Shortness of Breath    HPI Paula Gilmore is a 48 y.o. female that presents to emergency department for evaluation of nasal congestion, facial pain, cough, shortness of breath for 2 months, worsening for 2 weeks.  Patient states that she has a history of chronic sinus infections.  She finished a course of prednisone and doxycycline 20 days prior.  She was referred to see ENT but has not followed up with them because she needed a negative Covid test.  She states that she did not have the money to see anyone to get a negative test that she waited the 14-day quarantine, hoping ENT would see her then and they still did not.  She has been told previously that she has COPD.  She has an albuterol inhaler and nebulizer at that she has been using at home.  Her symptoms are making her anxious.  No fevers, chest pain, vomiting, diarrhea.   Past Medical History:  Diagnosis Date  . Hypertension     Patient Active Problem List   Diagnosis Date Noted  . Headache (Primary Area of Pain) 01/14/2017  . Chronic low back pain (Secondary Area of Pain) (B) (R>L) 01/14/2017  . Chronic pain of lower extremity [facility] Area of Pain) (Bilateral) (R>L) 01/14/2017  . Bilateral chronic knee pain (Fourth Area of Pain) (Bilateral) (R>L) 01/14/2017  . Chronic upper extremity pain 01/14/2017  . Chronic shoulder pain (B) (R>L) 01/14/2017  . Chronic neck pain (midline) 01/14/2017  . Disorder of bone, unspecified 01/14/2017  . Other long term (current) drug therapy 01/14/2017  . Other specified health status 01/14/2017  . Chronic pain syndrome 01/14/2017  . Chest pain with high risk for cardiac etiology 09/15/2016  . Mitral valve regurgitation 09/15/2016  . Shortness of  breath 09/15/2016  . Sleep apnea 09/15/2016    History reviewed. No pertinent surgical history.  Prior to Admission medications   Medication Sig Start Date End Date Taking? Authorizing Provider  acetaminophen (TYLENOL) 325 MG tablet Take 1,000 mg by mouth every 6 (six) hours.    [provider]  Aspirin-Acetaminophen-Caffeine (GOODYS EXTRA STRENGTH) [phone] MG PACK Take 1 packet by mouth as needed.    [provider]  azithromycin (ZITHROMAX Z-PAK) 250 MG tablet Take 2 tablets (500 mg) on  Day 1,  followed by 1 tablet (250 mg) once daily on Days 2 through 5. 11/26/18   Laban Emperor, PA-C  hydrochlorothiazide (HYDRODIURIL) 25 MG tablet Take 25 mg by mouth daily.    [provider]  ibuprofen (ADVIL,MOTRIN) 200 MG tablet Take 800 mg by mouth every 6 (six) hours.    [provider]  loperamide (IMODIUM) 2 MG capsule Take 2 mg by mouth as needed.    [provider]  methocarbamol (ROBAXIN) 750 MG tablet Take 750 mg by mouth 4 (four) times daily.    [provider]  naproxen (NAPROSYN) 250 MG tablet Take 250 mg by mouth 2 (two) times daily.    [provider]  ondansetron (ZOFRAN ODT) 4 MG disintegrating tablet Take 1 tablet (4 mg total) by mouth every 6 (six) hours as needed for nausea or vomiting. 10/08/18   Delman Kitten, MD  predniSONE (DELTASONE) 50 MG tablet Take 1 tablet per day 11/26/18   Laban Emperor, PA-C  SUCRALFATE PO Take  1 tablet by mouth 4 (four) times daily.    [provider]    Allergies Cymbalta [duloxetine hcl], Montelukast, Tizanidine, and Savella [milnacipran hcl]  No family history on file.  Social History Social History   Tobacco Use  . Smoking status: Current Some Day Smoker  . Smokeless tobacco: Never Used  Substance Use Topics  . Alcohol use: No    Frequency: Never  . Drug use: No     Review of Systems  Constitutional: No fever/chills Eyes: No visual changes. No discharge. ENT:  Positive for congestion and rhinorrhea. Cardiovascular: No chest pain. Respiratory: Positive for cough and SOB. Gastrointestinal: No abdominal pain.  No nausea, no vomiting.  No diarrhea.  No constipation. Musculoskeletal: Negative for musculoskeletal pain. Skin: Negative for rash, abrasions, lacerations, ecchymosis. Neurological: Negative for headaches.   ____________________________________________   PHYSICAL EXAM:  VITAL SIGNS: ED Triage Vitals  Enc Vitals Group     BP 11/26/18 1713 (!) 155/88     Pulse Rate 11/26/18 1713 92     Resp 11/26/18 1713 16     Temp 11/26/18 1713 98.4 F (36.9 C)     Temp Source 11/26/18 1713 Oral     SpO2 11/26/18 1713 95 %     Weight 11/26/18 1709 (!) 350 lb (158.8 kg)     Height 11/26/18 1709 5\' 8"  (1.727 m)     Head Circumference --      Peak Flow --      Pain Score 11/26/18 1709 0     Pain Loc --      Pain Edu? --      Excl. in [place]? --      Constitutional: Alert and oriented. Well appearing and in no acute distress. Eyes: Conjunctivae are normal. PERRL. EOMI. No discharge. Head: Atraumatic. ENT: No frontal and maxillary sinus tenderness.      Ears: Tympanic membranes pearly gray with good landmarks. No discharge.      Nose: Mild congestion/rhinnorhea.      Mouth/Throat: Mucous membranes are moist. Oropharynx non-erythematous. Tonsils not enlarged. No exudates. Uvula midline. Neck: No stridor.   Hematological/Lymphatic/Immunilogical: No cervical lymphadenopathy. Cardiovascular: Normal rate, regular rhythm.  Good peripheral circulation. Respiratory: Normal respiratory effort without tachypnea or retractions. Lungs CTAB. Good air entry to the bases with no decreased or absent breath sounds. Gastrointestinal: Bowel sounds 4 quadrants. Soft and nontender to palpation. No guarding or rigidity. No palpable masses. No distention. Musculoskeletal: Full range of motion to all extremities. No gross deformities appreciated. Neurologic:  Normal  speech and language. No gross focal neurologic deficits are appreciated.  Skin:  Skin is warm, dry and intact. No rash noted. Psychiatric: Mood and affect are normal. Speech and behavior are normal. Patient exhibits appropriate insight and judgement.   ____________________________________________   LABS (all labs ordered are listed, but only abnormal results are displayed)  Labs Reviewed  BASIC METABOLIC PANEL - Abnormal; Notable for the following components:      Result Value   Glucose, Bld 123 (*)    All other components within normal limits  SARS CORONAVIRUS 2 (TAT 6-24 HRS)  CBC  BRAIN NATRIURETIC PEPTIDE   ____________________________________________  EKG   ____________________________________________  RADIOLOGY Robinette Haines, personally viewed and evaluated these images (plain radiographs) as part of my medical decision making, as well as reviewing the written report by the radiologist.  Dg Chest Portable 1 View  Result Date: 11/26/2018 CLINICAL DATA:  Shortness of breath EXAM: PORTABLE CHEST 1 VIEW COMPARISON:  10/08/2018, 01/14/2017 FINDINGS: Diffuse increased interstitial opacity similar as compared with September 2020. No consolidation or effusion. Normal heart size. No pneumothorax. Possible clips over the left breast IMPRESSION: Diffuse increased interstitial opacity, suggesting central airways inflammation or possible reactive airways. No focal pneumonia. Electronically Signed   By: Donavan Foil M.D.   On: 11/26/2018 18:29    ____________________________________________    PROCEDURES  Procedure(s) performed:    Procedures    Medications  ipratropium-albuterol (DUONEB) 0.5-2.5 (3) MG/3ML nebulizer solution 3 mL (3 mLs Nebulization Given 11/26/18 1844)  azithromycin (ZITHROMAX) tablet 500 mg (500 mg Oral Given 11/26/18 2019)  methylPREDNISolone sodium succinate (SOLU-MEDROL) 125 mg/2 mL injection 125 mg (125 mg Intravenous Given 11/26/18 2019)      ____________________________________________   INITIAL IMPRESSION / ASSESSMENT AND PLAN / ED COURSE  Pertinent labs & imaging results that were available during my care of the patient were reviewed by me and considered in my medical decision making (see chart for details).  Review of the [place] CSRS was performed in accordance of the [place] prior to dispensing any controlled drugs.     Patient's diagnosis is consistent with bronchitis. Vital signs and exam are reassuring.  Chest x-ray concerning for central airway thickening and no focal pneumonia.  CBC, CMP, BNP are reassuring.  Patient felt much improved after DuoNeb treatment.  She does not feel that she needs another.  She appears well after treatment.  She was given IM Solu-Medrol and a dose of azithromycin in the emergency department.  Patient appears well and is staying well hydrated.  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  Patient should alternate tylenol and ibuprofen for fever. Patient feels comfortable going home. Patient will be discharged home with prescriptions for prednisone and azithromycin. Patient is to follow up with primary care and ENT as needed or otherwise directed. Patient is given ED precautions to return to the ED for any worsening or new symptoms.   Paula Gilmore was evaluated in Emergency Department on 11/26/2018 for the symptoms described in the history of present illness. She was evaluated in the context of the global COVID-19 pandemic, which necessitated consideration that the patient might be at risk for infection with the SARS-CoV-2 virus that causes COVID-19. Institutional protocols and algorithms that pertain to the evaluation of patients at risk for COVID-19 are in a state of rapid change based on information released by regulatory bodies including the CDC and federal and state organizations. These policies and algorithms were followed during  the patient's care in the ED.  ____________________________________________  FINAL CLINICAL IMPRESSION(S) / ED DIAGNOSES  Final diagnoses:  Bronchitis  Chronic obstructive pulmonary disease, unspecified COPD type ([place])      NEW MEDICATIONS STARTED DURING THIS VISIT:  ED Discharge Orders         Ordered    azithromycin (ZITHROMAX Z-PAK) 250 MG tablet     11/26/18 2007    predniSONE (DELTASONE) 50 MG tablet     11/26/18 2007              This chart was dictated using voice recognition software/Dragon. Despite best efforts to proofread, errors can occur which can change the meaning. Any change was purely unintentional.    Laban Emperor, PA-C 11/26/18 2329    Harvest Dark, MD 12/01/18 2056

## 2018-11-26 NOTE — ED Triage Notes (Addendum)
C/O SOB, due to sinus congestion,  today.  States symptoms started at around 0300.  Patient states anxiety is worsening with the feeling.  States typically patient starts with similar symptoms and they are related to a sinus infection.  She typically needs antibiotics and steroids.  Was treated antibiotics on 10/11-- Doxycycline and prednisone.

## 2018-11-27 LAB — SARS CORONAVIRUS 2 (TAT 6-24 HRS): SARS Coronavirus 2: NEGATIVE

## 2019-10-19 ENCOUNTER — Encounter: Payer: Self-pay | Admitting: *Deleted

## 2019-11-03 ENCOUNTER — Other Ambulatory Visit: Payer: Self-pay

## 2019-11-03 ENCOUNTER — Encounter: Payer: Self-pay | Admitting: Emergency Medicine

## 2019-11-03 DIAGNOSIS — Z6841 Body Mass Index (BMI) 40.0 and over, adult: Secondary | ICD-10-CM

## 2019-11-03 DIAGNOSIS — H538 Other visual disturbances: Secondary | ICD-10-CM | POA: Diagnosis present

## 2019-11-03 DIAGNOSIS — Z7952 Long term (current) use of systemic steroids: Secondary | ICD-10-CM

## 2019-11-03 DIAGNOSIS — Z791 Long term (current) use of non-steroidal anti-inflammatories (NSAID): Secondary | ICD-10-CM

## 2019-11-03 DIAGNOSIS — I10 Essential (primary) hypertension: Secondary | ICD-10-CM | POA: Diagnosis present

## 2019-11-03 DIAGNOSIS — G4730 Sleep apnea, unspecified: Secondary | ICD-10-CM | POA: Diagnosis present

## 2019-11-03 DIAGNOSIS — R682 Dry mouth, unspecified: Secondary | ICD-10-CM | POA: Diagnosis present

## 2019-11-03 DIAGNOSIS — R631 Polydipsia: Secondary | ICD-10-CM | POA: Diagnosis present

## 2019-11-03 DIAGNOSIS — K219 Gastro-esophageal reflux disease without esophagitis: Secondary | ICD-10-CM | POA: Diagnosis present

## 2019-11-03 DIAGNOSIS — R3589 Other polyuria: Secondary | ICD-10-CM | POA: Diagnosis present

## 2019-11-03 DIAGNOSIS — Z79899 Other long term (current) drug therapy: Secondary | ICD-10-CM

## 2019-11-03 DIAGNOSIS — M797 Fibromyalgia: Secondary | ICD-10-CM | POA: Diagnosis present

## 2019-11-03 DIAGNOSIS — I081 Rheumatic disorders of both mitral and tricuspid valves: Secondary | ICD-10-CM | POA: Diagnosis present

## 2019-11-03 DIAGNOSIS — Z87891 Personal history of nicotine dependence: Secondary | ICD-10-CM

## 2019-11-03 DIAGNOSIS — J440 Chronic obstructive pulmonary disease with acute lower respiratory infection: Secondary | ICD-10-CM | POA: Diagnosis present

## 2019-11-03 DIAGNOSIS — I429 Cardiomyopathy, unspecified: Secondary | ICD-10-CM | POA: Diagnosis present

## 2019-11-03 DIAGNOSIS — M1990 Unspecified osteoarthritis, unspecified site: Secondary | ICD-10-CM | POA: Diagnosis present

## 2019-11-03 DIAGNOSIS — N179 Acute kidney failure, unspecified: Secondary | ICD-10-CM | POA: Diagnosis present

## 2019-11-03 DIAGNOSIS — J189 Pneumonia, unspecified organism: Secondary | ICD-10-CM | POA: Diagnosis present

## 2019-11-03 DIAGNOSIS — E1110 Type 2 diabetes mellitus with ketoacidosis without coma: Principal | ICD-10-CM | POA: Diagnosis present

## 2019-11-03 DIAGNOSIS — Z20822 Contact with and (suspected) exposure to covid-19: Secondary | ICD-10-CM | POA: Diagnosis present

## 2019-11-03 DIAGNOSIS — Z888 Allergy status to other drugs, medicaments and biological substances status: Secondary | ICD-10-CM

## 2019-11-03 DIAGNOSIS — G43909 Migraine, unspecified, not intractable, without status migrainosus: Secondary | ICD-10-CM | POA: Diagnosis present

## 2019-11-03 DIAGNOSIS — G8929 Other chronic pain: Secondary | ICD-10-CM | POA: Diagnosis present

## 2019-11-03 DIAGNOSIS — N390 Urinary tract infection, site not specified: Secondary | ICD-10-CM | POA: Diagnosis present

## 2019-11-03 LAB — URINALYSIS, COMPLETE (UACMP) WITH MICROSCOPIC
Bilirubin Urine: NEGATIVE
Glucose, UA: >=500 mg/dL — AB
Ketones, ur: 5 mg/dL — AB
Nitrite: NEGATIVE
Protein, ur: NEGATIVE mg/dL
Specific Gravity, Urine: 1.027 (ref 1.005–1.030)
pH: 5 (ref 5.0–8.0)

## 2019-11-03 LAB — CBC
HCT: 41 % (ref 36.0–46.0)
Hemoglobin: 14.4 g/dL (ref 12.0–15.0)
MCH: 30.1 pg (ref 26.0–34.0)
MCHC: 35.1 g/dL (ref 30.0–36.0)
MCV: 85.8 fL (ref 80.0–100.0)
Platelets: 298 10*3/uL (ref 150–400)
RBC: 4.78 MIL/uL (ref 3.87–5.11)
RDW: 13 % (ref 11.5–15.5)
WBC: 11.9 10*3/uL — ABNORMAL HIGH (ref 4.0–10.5)
nRBC: 0 % (ref 0.0–0.2)

## 2019-11-03 LAB — BASIC METABOLIC PANEL
Anion gap: 18 — ABNORMAL HIGH (ref 5–15)
BUN: 27 mg/dL — ABNORMAL HIGH (ref 6–20)
CO2: 22 mmol/L (ref 22–32)
Calcium: 10.1 mg/dL (ref 8.9–10.3)
Chloride: 86 mmol/L — ABNORMAL LOW (ref 98–111)
Creatinine, Ser: 1.05 mg/dL — ABNORMAL HIGH (ref 0.44–1.00)
GFR calc non Af Amer: >60 mL/min (ref >60)
Glucose, Bld: 757 mg/dL (ref 70–99)
Potassium: 4.7 mmol/L (ref 3.5–5.1)
Sodium: 126 mmol/L — ABNORMAL LOW (ref 135–145)

## 2019-11-03 LAB — BLOOD GAS, VENOUS
Acid-Base Excess: 3.3 mmol/L — ABNORMAL HIGH (ref 0.0–2.0)
Bicarbonate: 27.8 mmol/L (ref 20.0–28.0)
O2 Saturation: 94.2 %
Patient temperature: 37
pCO2, Ven: 41 mmHg — ABNORMAL LOW (ref 44.0–60.0)
pH, Ven: 7.44 — ABNORMAL HIGH (ref 7.250–7.430)
pO2, Ven: 69 mmHg — ABNORMAL HIGH (ref 32.0–45.0)

## 2019-11-03 LAB — GLUCOSE, CAPILLARY
Glucose-Capillary: >600 mg/dL (ref 70–99)
Glucose-Capillary: >600 mg/dL (ref 70–99)
Glucose-Capillary: >600 mg/dL (ref 70–99)

## 2019-11-03 MED ORDER — SODIUM CHLORIDE 0.9 % IV BOLUS
1000.0000 mL | Freq: Once | INTRAVENOUS | Status: AC
  Administered 2019-11-03: 1000 mL via INTRAVENOUS

## 2019-11-03 MED ORDER — ONDANSETRON HCL 4 MG/2ML IJ SOLN
INTRAMUSCULAR | Status: AC
  Administered 2019-11-04: 4 mg via INTRAVENOUS
  Filled 2019-11-03: qty 2

## 2019-11-03 MED ORDER — ONDANSETRON HCL 4 MG/2ML IJ SOLN
4.0000 mg | Freq: Once | INTRAMUSCULAR | Status: AC
  Filled 2019-11-03: qty 2

## 2019-11-03 NOTE — ED Triage Notes (Signed)
EMS brought pt in from home for weakness, dizziness; pt to lobby via w/c with no distress noted; just completed prednisone for "pneumonia"

## 2019-11-03 NOTE — ED Triage Notes (Signed)
Pt to ED via EMS from home c/o hyperglycemia.  States she's had blurry vision, dry mouth, extreme thirst, increased urination for about 3 days.  Recently treated for pneumonia by PCP with ABX and steroid.  Advised to go to urgent care by PCP but didn't feel like she could so called EMS instead.  Denies hx of diabetes.  Pt A&Ox4, chest rise even and unlabored, in NAD at this time.

## 2019-11-04 ENCOUNTER — Emergency Department: Payer: Medicaid Other

## 2019-11-04 ENCOUNTER — Inpatient Hospital Stay: Payer: Medicaid Other

## 2019-11-04 ENCOUNTER — Inpatient Hospital Stay
Admission: EM | Admit: 2019-11-04 | Discharge: 2019-11-10 | DRG: 637 | Disposition: A | Discharge status: Home Health | Payer: Medicaid Other | Attending: Family Medicine | Admitting: Family Medicine

## 2019-11-04 DIAGNOSIS — R739 Hyperglycemia, unspecified: Secondary | ICD-10-CM | POA: Diagnosis present

## 2019-11-04 DIAGNOSIS — R519 Headache, unspecified: Secondary | ICD-10-CM | POA: Diagnosis not present

## 2019-11-04 DIAGNOSIS — J440 Chronic obstructive pulmonary disease with acute lower respiratory infection: Secondary | ICD-10-CM | POA: Diagnosis present

## 2019-11-04 DIAGNOSIS — G8929 Other chronic pain: Secondary | ICD-10-CM | POA: Diagnosis present

## 2019-11-04 DIAGNOSIS — Z79899 Other long term (current) drug therapy: Secondary | ICD-10-CM | POA: Diagnosis not present

## 2019-11-04 DIAGNOSIS — M25471 Effusion, right ankle: Secondary | ICD-10-CM

## 2019-11-04 DIAGNOSIS — J189 Pneumonia, unspecified organism: Secondary | ICD-10-CM | POA: Diagnosis present

## 2019-11-04 DIAGNOSIS — G4730 Sleep apnea, unspecified: Secondary | ICD-10-CM | POA: Diagnosis present

## 2019-11-04 DIAGNOSIS — N3001 Acute cystitis with hematuria: Secondary | ICD-10-CM | POA: Diagnosis not present

## 2019-11-04 DIAGNOSIS — M797 Fibromyalgia: Secondary | ICD-10-CM

## 2019-11-04 DIAGNOSIS — M1990 Unspecified osteoarthritis, unspecified site: Secondary | ICD-10-CM | POA: Diagnosis present

## 2019-11-04 DIAGNOSIS — Z7952 Long term (current) use of systemic steroids: Secondary | ICD-10-CM | POA: Diagnosis not present

## 2019-11-04 DIAGNOSIS — R682 Dry mouth, unspecified: Secondary | ICD-10-CM | POA: Diagnosis present

## 2019-11-04 DIAGNOSIS — I10 Essential (primary) hypertension: Secondary | ICD-10-CM | POA: Diagnosis present

## 2019-11-04 DIAGNOSIS — N390 Urinary tract infection, site not specified: Secondary | ICD-10-CM

## 2019-11-04 DIAGNOSIS — N179 Acute kidney failure, unspecified: Secondary | ICD-10-CM | POA: Diagnosis present

## 2019-11-04 DIAGNOSIS — E119 Type 2 diabetes mellitus without complications: Secondary | ICD-10-CM

## 2019-11-04 DIAGNOSIS — Z87891 Personal history of nicotine dependence: Secondary | ICD-10-CM | POA: Diagnosis not present

## 2019-11-04 DIAGNOSIS — E871 Hypo-osmolality and hyponatremia: Secondary | ICD-10-CM

## 2019-11-04 DIAGNOSIS — K219 Gastro-esophageal reflux disease without esophagitis: Secondary | ICD-10-CM | POA: Diagnosis present

## 2019-11-04 DIAGNOSIS — G894 Chronic pain syndrome: Secondary | ICD-10-CM | POA: Diagnosis not present

## 2019-11-04 DIAGNOSIS — G43909 Migraine, unspecified, not intractable, without status migrainosus: Secondary | ICD-10-CM | POA: Diagnosis present

## 2019-11-04 DIAGNOSIS — H538 Other visual disturbances: Secondary | ICD-10-CM

## 2019-11-04 DIAGNOSIS — Z791 Long term (current) use of non-steroidal anti-inflammatories (NSAID): Secondary | ICD-10-CM | POA: Diagnosis not present

## 2019-11-04 DIAGNOSIS — I429 Cardiomyopathy, unspecified: Secondary | ICD-10-CM | POA: Diagnosis present

## 2019-11-04 DIAGNOSIS — R0600 Dyspnea, unspecified: Secondary | ICD-10-CM | POA: Diagnosis not present

## 2019-11-04 DIAGNOSIS — M25571 Pain in right ankle and joints of right foot: Secondary | ICD-10-CM | POA: Diagnosis not present

## 2019-11-04 DIAGNOSIS — E1110 Type 2 diabetes mellitus with ketoacidosis without coma: Secondary | ICD-10-CM

## 2019-11-04 DIAGNOSIS — Z20822 Contact with and (suspected) exposure to covid-19: Secondary | ICD-10-CM | POA: Diagnosis present

## 2019-11-04 DIAGNOSIS — E0910 Drug or chemical induced diabetes mellitus with ketoacidosis without coma: Secondary | ICD-10-CM

## 2019-11-04 DIAGNOSIS — I081 Rheumatic disorders of both mitral and tricuspid valves: Secondary | ICD-10-CM | POA: Diagnosis present

## 2019-11-04 DIAGNOSIS — Z888 Allergy status to other drugs, medicaments and biological substances status: Secondary | ICD-10-CM | POA: Diagnosis not present

## 2019-11-04 DIAGNOSIS — Z6841 Body Mass Index (BMI) 40.0 and over, adult: Secondary | ICD-10-CM | POA: Diagnosis not present

## 2019-11-04 HISTORY — DX: Gastro-esophageal reflux disease without esophagitis: K21.9

## 2019-11-04 HISTORY — DX: Chronic obstructive pulmonary disease, unspecified: J44.9

## 2019-11-04 HISTORY — DX: Nonrheumatic mitral (valve) insufficiency: I34.0

## 2019-11-04 HISTORY — DX: Malignant (primary) neoplasm, unspecified: C80.1

## 2019-11-04 HISTORY — DX: Fibromyalgia: M79.7

## 2019-11-04 HISTORY — DX: Migraine, unspecified, not intractable, without status migrainosus: G43.909

## 2019-11-04 HISTORY — DX: Unspecified osteoarthritis, unspecified site: M19.90

## 2019-11-04 HISTORY — DX: Cardiomyopathy, unspecified: I42.9

## 2019-11-04 LAB — CBC
HCT: 39.4 % (ref 36.0–46.0)
Hemoglobin: 13.8 g/dL (ref 12.0–15.0)
MCH: 30.1 pg (ref 26.0–34.0)
MCHC: 35 g/dL (ref 30.0–36.0)
MCV: 86 fL (ref 80.0–100.0)
Platelets: 284 10*3/uL (ref 150–400)
RBC: 4.58 MIL/uL (ref 3.87–5.11)
RDW: 13.2 % (ref 11.5–15.5)
WBC: 12.1 10*3/uL — ABNORMAL HIGH (ref 4.0–10.5)
nRBC: 0 % (ref 0.0–0.2)

## 2019-11-04 LAB — BASIC METABOLIC PANEL
Anion gap: 21 — ABNORMAL HIGH (ref 5–15)
Anion gap: 17 — ABNORMAL HIGH (ref 5–15)
Anion gap: 18 — ABNORMAL HIGH (ref 5–15)
Anion gap: 16 — ABNORMAL HIGH (ref 5–15)
Anion gap: 16 — ABNORMAL HIGH (ref 5–15)
BUN: 23 mg/dL — ABNORMAL HIGH (ref 6–20)
BUN: 19 mg/dL (ref 6–20)
BUN: 17 mg/dL (ref 6–20)
BUN: 14 mg/dL (ref 6–20)
BUN: 14 mg/dL (ref 6–20)
CO2: 22 mmol/L (ref 22–32)
CO2: 25 mmol/L (ref 22–32)
CO2: 26 mmol/L (ref 22–32)
CO2: 24 mmol/L (ref 22–32)
CO2: 23 mmol/L (ref 22–32)
Calcium: 9.6 mg/dL (ref 8.9–10.3)
Calcium: 9.1 mg/dL (ref 8.9–10.3)
Calcium: 9.2 mg/dL (ref 8.9–10.3)
Calcium: 9.2 mg/dL (ref 8.9–10.3)
Calcium: 9.4 mg/dL (ref 8.9–10.3)
Chloride: 84 mmol/L — ABNORMAL LOW (ref 98–111)
Chloride: 89 mmol/L — ABNORMAL LOW (ref 98–111)
Chloride: 89 mmol/L — ABNORMAL LOW (ref 98–111)
Chloride: 90 mmol/L — ABNORMAL LOW (ref 98–111)
Chloride: 93 mmol/L — ABNORMAL LOW (ref 98–111)
Creatinine, Ser: 0.98 mg/dL (ref 0.44–1.00)
Creatinine, Ser: 0.7 mg/dL (ref 0.44–1.00)
Creatinine, Ser: 0.8 mg/dL (ref 0.44–1.00)
Creatinine, Ser: 0.78 mg/dL (ref 0.44–1.00)
Creatinine, Ser: 0.78 mg/dL (ref 0.44–1.00)
GFR calc non Af Amer: >60 mL/min (ref >60)
GFR calc non Af Amer: >60 mL/min (ref >60)
GFR calc non Af Amer: >60 mL/min (ref >60)
GFR, Estimated: >60 mL/min (ref >60)
GFR, Estimated: >60 mL/min (ref >60)
Glucose, Bld: 584 mg/dL (ref 70–99)
Glucose, Bld: 285 mg/dL — ABNORMAL HIGH (ref 70–99)
Glucose, Bld: 221 mg/dL — ABNORMAL HIGH (ref 70–99)
Glucose, Bld: 268 mg/dL — ABNORMAL HIGH (ref 70–99)
Glucose, Bld: 187 mg/dL — ABNORMAL HIGH (ref 70–99)
Potassium: 4.3 mmol/L (ref 3.5–5.1)
Potassium: 3.3 mmol/L — ABNORMAL LOW (ref 3.5–5.1)
Potassium: 3.1 mmol/L — ABNORMAL LOW (ref 3.5–5.1)
Potassium: 3.6 mmol/L (ref 3.5–5.1)
Potassium: 3.5 mmol/L (ref 3.5–5.1)
Sodium: 127 mmol/L — ABNORMAL LOW (ref 135–145)
Sodium: 131 mmol/L — ABNORMAL LOW (ref 135–145)
Sodium: 133 mmol/L — ABNORMAL LOW (ref 135–145)
Sodium: 130 mmol/L — ABNORMAL LOW (ref 135–145)
Sodium: 132 mmol/L — ABNORMAL LOW (ref 135–145)

## 2019-11-04 LAB — GLUCOSE, CAPILLARY
Glucose-Capillary: 597 mg/dL (ref 70–99)
Glucose-Capillary: 412 mg/dL — ABNORMAL HIGH (ref 70–99)
Glucose-Capillary: 367 mg/dL — ABNORMAL HIGH (ref 70–99)
Glucose-Capillary: 373 mg/dL — ABNORMAL HIGH (ref 70–99)
Glucose-Capillary: 322 mg/dL — ABNORMAL HIGH (ref 70–99)
Glucose-Capillary: 254 mg/dL — ABNORMAL HIGH (ref 70–99)
Glucose-Capillary: 220 mg/dL — ABNORMAL HIGH (ref 70–99)
Glucose-Capillary: 231 mg/dL — ABNORMAL HIGH (ref 70–99)
Glucose-Capillary: 294 mg/dL — ABNORMAL HIGH (ref 70–99)
Glucose-Capillary: 311 mg/dL — ABNORMAL HIGH (ref 70–99)
Glucose-Capillary: 330 mg/dL — ABNORMAL HIGH (ref 70–99)
Glucose-Capillary: 303 mg/dL — ABNORMAL HIGH (ref 70–99)
Glucose-Capillary: 275 mg/dL — ABNORMAL HIGH (ref 70–99)
Glucose-Capillary: 262 mg/dL — ABNORMAL HIGH (ref 70–99)
Glucose-Capillary: 231 mg/dL — ABNORMAL HIGH (ref 70–99)
Glucose-Capillary: 250 mg/dL — ABNORMAL HIGH (ref 70–99)
Glucose-Capillary: 287 mg/dL — ABNORMAL HIGH (ref 70–99)
Glucose-Capillary: 177 mg/dL — ABNORMAL HIGH (ref 70–99)

## 2019-11-04 LAB — RESPIRATORY PANEL BY RT PCR (FLU A&B, COVID)
Influenza A by PCR: NEGATIVE
Influenza B by PCR: NEGATIVE
SARS Coronavirus 2 by RT PCR: NEGATIVE

## 2019-11-04 LAB — BETA-HYDROXYBUTYRIC ACID
Beta-Hydroxybutyric Acid: 1.25 mmol/L — ABNORMAL HIGH (ref 0.05–0.27)
Beta-Hydroxybutyric Acid: 0.29 mmol/L — ABNORMAL HIGH (ref 0.05–0.27)

## 2019-11-04 LAB — MAGNESIUM: Magnesium: 2 mg/dL (ref 1.7–2.4)

## 2019-11-04 LAB — HIV ANTIBODY (ROUTINE TESTING W REFLEX): HIV Screen 4th Generation wRfx: NONREACTIVE

## 2019-11-04 MED ORDER — ACETAMINOPHEN 500 MG PO TABS
1000.0000 mg | ORAL_TABLET | Freq: Four times a day (QID) | ORAL | Status: DC
  Administered 2019-11-04 – 2019-11-07 (×9): 1000 mg via ORAL
  Filled 2019-11-04 (×11): qty 2

## 2019-11-04 MED ORDER — SODIUM CHLORIDE 0.9 % IV SOLN
1.0000 g | Freq: Once | INTRAVENOUS | Status: AC
  Administered 2019-11-04: 1 g via INTRAVENOUS
  Filled 2019-11-04: qty 10

## 2019-11-04 MED ORDER — LACTATED RINGERS IV SOLN: INTRAVENOUS | Status: DC

## 2019-11-04 MED ORDER — DEXTROSE IN LACTATED RINGERS 5 % IV SOLN: INTRAVENOUS | Status: DC

## 2019-11-04 MED ORDER — INSULIN REGULAR(HUMAN) IN NACL 100-0.9 UT/100ML-% IV SOLN: INTRAVENOUS | Status: DC

## 2019-11-04 MED ORDER — ENOXAPARIN SODIUM 40 MG/0.4ML ~~LOC~~ SOLN
40.0000 mg | Freq: Two times a day (BID) | SUBCUTANEOUS | Status: DC
  Administered 2019-11-04 – 2019-11-07 (×7): 40 mg via SUBCUTANEOUS
  Filled 2019-11-04 (×8): qty 0.4

## 2019-11-04 MED ORDER — POTASSIUM CHLORIDE CRYS ER 20 MEQ PO TBCR
40.0000 meq | EXTENDED_RELEASE_TABLET | Freq: Once | ORAL | Status: AC
  Administered 2019-11-04: 40 meq via ORAL
  Filled 2019-11-04: qty 2

## 2019-11-04 MED ORDER — POTASSIUM CHLORIDE 10 MEQ/100ML IV SOLN
10.0000 meq | INTRAVENOUS | Status: AC
  Administered 2019-11-04 (×2): 10 meq via INTRAVENOUS
  Filled 2019-11-04: qty 100

## 2019-11-04 MED ORDER — LACTATED RINGERS IV BOLUS
20.0000 mL/kg | Freq: Once | INTRAVENOUS | Status: AC
  Administered 2019-11-04: 3628 mL via INTRAVENOUS

## 2019-11-04 MED ORDER — DEXTROSE 50 % IV SOLN: 0.0000 mL | INTRAVENOUS | Status: DC | PRN

## 2019-11-04 MED ORDER — POTASSIUM CHLORIDE 10 MEQ/100ML IV SOLN
10.0000 meq | INTRAVENOUS | Status: AC
  Administered 2019-11-04: 10 meq via INTRAVENOUS

## 2019-11-04 MED ORDER — INSULIN REGULAR(HUMAN) IN NACL 100-0.9 UT/100ML-% IV SOLN
INTRAVENOUS | Status: DC
  Administered 2019-11-04: 16 [IU]/h via INTRAVENOUS
  Administered 2019-11-04: 9 [IU]/h via INTRAVENOUS
  Administered 2019-11-04: 8 [IU]/h via INTRAVENOUS
  Administered 2019-11-04: 15 [IU]/h via INTRAVENOUS
  Filled 2019-11-04 (×5): qty 100

## 2019-11-04 MED ORDER — METHOCARBAMOL 500 MG PO TABS
750.0000 mg | ORAL_TABLET | Freq: Four times a day (QID) | ORAL | Status: DC | PRN
  Administered 2019-11-05 – 2019-11-09 (×7): 750 mg via ORAL
  Filled 2019-11-04: qty 1
  Filled 2019-11-04: qty 2
  Filled 2019-11-04 (×2): qty 1
  Filled 2019-11-04 (×2): qty 2
  Filled 2019-11-04: qty 1
  Filled 2019-11-04 (×2): qty 2

## 2019-11-04 NOTE — ED Notes (Signed)
Lab called to collect BMP

## 2019-11-04 NOTE — ED Provider Notes (Signed)
[facility] Emergency Department Provider Note   ____________________________________________   First MD Initiated Contact with Patient 11/04/19 0030     (approximate)  I have reviewed the triage vital signs and the nursing notes.   HISTORY  Chief Complaint Hyperglycemia    HPI Paula Gilmore is a 49 y.o. female brought to the ED via EMS from home with a chief complaint of hyperglycemia.  Patient reports a 3-day history of blurry vision, dry mouth, extreme thirst and increased urination.  She just finished a Z-Pak and steroids for pneumonia which was prescribed by her PCP.  Denies personal history of diabetes.  Denies fever, cough, chest pain, shortness of breath, abdominal pain, nausea or vomiting.     Past Medical History:  Diagnosis Date  . Acid reflux   . Arthritis   . Cancer ([place])    Cervical  . Cardiomyopathy ([place])   . COPD (chronic obstructive pulmonary disease) ([place])   . Fibromyalgia   . Hypertension   . Migraine   . Mitral valve regurgitation     Patient Active Problem List   Diagnosis Date Noted  . Chronic pain 11/04/2019  . HTN (hypertension) 11/04/2019  . UTI (urinary tract infection) 11/04/2019  . DKA (diabetic ketoacidosis) ([place]) 11/04/2019  . New onset type 2 diabetes mellitus ([place]) 11/04/2019  . Morbid obesity with BMI of 60.0-69.9, adult ([place]) 07/19/2017  . Headache (Primary Area of Pain) 01/14/2017  . Chronic low back pain (Secondary Area of Pain) (B) (R>L) 01/14/2017  . Chronic pain of lower extremity [facility] Area of Pain) (Bilateral) (R>L) 01/14/2017  . Bilateral chronic knee pain (Fourth Area of Pain) (Bilateral) (R>L) 01/14/2017  . Chronic upper extremity pain 01/14/2017  . Chronic shoulder pain (B) (R>L) 01/14/2017  . Chronic neck pain (midline) 01/14/2017  . Disorder of bone, unspecified 01/14/2017  . Other long term (current) drug therapy 01/14/2017  . Other specified health status 01/14/2017  . Chronic  pain syndrome 01/14/2017  . Chest pain with high risk for cardiac etiology 09/15/2016  . Mitral valve regurgitation 09/15/2016  . Shortness of breath 09/15/2016  . Sleep apnea 09/15/2016    Past Surgical History:  Procedure Laterality Date  . CESAREAN SECTION  2009    Prior to Admission medications   Medication Sig Start Date End Date Taking? Authorizing Provider  acetaminophen (TYLENOL) 325 MG tablet Take 1,000 mg by mouth every 6 (six) hours.    [provider]  Aspirin-Acetaminophen-Caffeine (GOODYS EXTRA STRENGTH) [phone] MG PACK Take 1 packet by mouth as needed.    [provider]  azithromycin (ZITHROMAX Z-PAK) 250 MG tablet Take 2 tablets (500 mg) on  Day 1,  followed by 1 tablet (250 mg) once daily on Days 2 through 5. 11/26/18   Laban Emperor, PA-C  hydrochlorothiazide (HYDRODIURIL) 25 MG tablet Take 25 mg by mouth daily.    [provider]  ibuprofen (ADVIL,MOTRIN) 200 MG tablet Take 800 mg by mouth every 6 (six) hours.    [provider]  loperamide (IMODIUM) 2 MG capsule Take 2 mg by mouth as needed.    [provider]  methocarbamol (ROBAXIN) 750 MG tablet Take 750 mg by mouth 4 (four) times daily.    [provider]  naproxen (NAPROSYN) 250 MG tablet Take 250 mg by mouth 2 (two) times daily.    [provider]  ondansetron (ZOFRAN ODT) 4 MG disintegrating tablet Take 1 tablet (4 mg total) by mouth every 6 (six) hours  as needed for nausea or vomiting. 10/08/18   Delman Kitten, MD  predniSONE (DELTASONE) 50 MG tablet Take 1 tablet per day 11/26/18   Laban Emperor, PA-C  SUCRALFATE PO Take 1 tablet by mouth 4 (four) times daily.    [provider]    Allergies Cymbalta [duloxetine hcl], Montelukast, Tizanidine, and Savella [milnacipran hcl]  History reviewed. No pertinent family history.  Social History Social History   Tobacco Use  . Smoking status: Former Research scientist (life sciences)  . Smokeless tobacco: Never Used   Vaping Use  . Vaping Use: Never used  Substance Use Topics  . Alcohol use: No  . Drug use: No    Review of Systems  Constitutional: No fever/chills Eyes: No visual changes. ENT: No sore throat. Cardiovascular: Denies chest pain. Respiratory: Denies shortness of breath. Gastrointestinal: No abdominal pain.  No nausea, no vomiting.  No diarrhea.  No constipation. Genitourinary: Negative for dysuria. Musculoskeletal: Negative for back pain. Skin: Negative for rash. Neurological: Negative for headaches, focal weakness or numbness. Endocrine: Positive for polydipsia.  ____________________________________________   PHYSICAL EXAM:  VITAL SIGNS: ED Triage Vitals  Enc Vitals Group     BP 11/03/19 1955 110/88     Pulse Rate 11/03/19 1955 (!) 106     Resp 11/03/19 1955 16     Temp 11/03/19 1955 99.4 F (37.4 C)     Temp Source 11/03/19 1955 Oral     SpO2 11/03/19 1917 96 %     Weight 11/03/19 1955 (!) 400 lb (181.4 kg)     Height 11/03/19 1955 5\' 6"  (1.676 m)     Head Circumference --      Peak Flow --      Pain Score 11/03/19 1954 8     Pain Loc --      Pain Edu? --      Excl. in [place]? --     Constitutional: Alert and oriented.  Fatigued appearing and in no acute distress. Eyes: Conjunctivae are normal. PERRL. EOMI. Head: Atraumatic. Nose: No congestion/rhinnorhea. Mouth/Throat: Mucous membranes are mildly dry.   Neck: No stridor.   Cardiovascular: Normal rate, regular rhythm. Grossly normal heart sounds.  Good peripheral circulation. Respiratory: Normal respiratory effort.  No retractions. Lungs CTAB. Gastrointestinal: Morbidly obese.  Soft and nontender. No distention. No abdominal bruits. No CVA tenderness. Musculoskeletal: No lower extremity tenderness nor edema.  No joint effusions. Neurologic:  Normal speech and language. No gross focal neurologic deficits are appreciated.  Skin:  Skin is warm, dry and intact. No rash noted. Psychiatric: Mood and affect are  normal. Speech and behavior are normal.  ____________________________________________   LABS (all labs ordered are listed, but only abnormal results are displayed)  Labs Reviewed  GLUCOSE, CAPILLARY - Abnormal; Notable for the following components:      Result Value   Glucose-Capillary >600 (*)    All other components within normal limits  BASIC METABOLIC PANEL - Abnormal; Notable for the following components:   Sodium 126 (*)    Chloride 86 (*)    Glucose, Bld 757 (*)    BUN 27 (*)    Creatinine, Ser 1.05 (*)    Anion gap 18 (*)    All other components within normal limits  CBC - Abnormal; Notable for the following components:   WBC 11.9 (*)    All other components within normal limits  URINALYSIS, COMPLETE (UACMP) WITH MICROSCOPIC - Abnormal; Notable for the following components:   Color, Urine STRAW (*)  APPearance HAZY (*)    Glucose, UA >=500 (*)    Hgb urine dipstick MODERATE (*)    Ketones, ur 5 (*)    Leukocytes,Ua SMALL (*)    Bacteria, UA RARE (*)    All other components within normal limits  BLOOD GAS, VENOUS - Abnormal; Notable for the following components:   pH, Ven 7.44 (*)    pCO2, Ven 41 (*)    pO2, Ven 69.0 (*)    Acid-Base Excess 3.3 (*)    All other components within normal limits  GLUCOSE, CAPILLARY - Abnormal; Notable for the following components:   Glucose-Capillary >600 (*)    All other components within normal limits  GLUCOSE, CAPILLARY - Abnormal; Notable for the following components:   Glucose-Capillary >600 (*)    All other components within normal limits  BETA-HYDROXYBUTYRIC ACID - Abnormal; Notable for the following components:   Beta-Hydroxybutyric Acid 1.25 (*)    All other components within normal limits  RESPIRATORY PANEL BY RT PCR (FLU A&B, COVID)  URINE CULTURE  HEMOGLOBIN A1C  HIV ANTIBODY (ROUTINE TESTING W REFLEX)  CBC  BASIC METABOLIC PANEL  BASIC METABOLIC PANEL  BASIC METABOLIC PANEL  BASIC METABOLIC PANEL  BASIC  METABOLIC PANEL  CBG MONITORING, ED  CBG MONITORING, ED  POC URINE PREG, ED  CBG MONITORING, ED   ____________________________________________  EKG  None ____________________________________________  RADIOLOGY I, Loranda Mastel J, personally viewed and evaluated these images (plain radiographs) as part of my medical decision making, as well as reviewing the written report by the radiologist.  ED MD interpretation: No acute cardiopulmonary process  Official radiology report(s): DG Chest Port 1 View  Result Date: 11/04/2019 CLINICAL DATA:  Recent pneumonia EXAM: PORTABLE CHEST 1 VIEW COMPARISON:  11/26/2018 FINDINGS: Prominent interstitial lung markings are again noted. There is no pneumothorax or large pleural effusion. No focal infiltrate. The heart size is stable from prior study. There is no acute osseous abnormality. IMPRESSION: Unchanged bilateral bronchitic changes. Electronically Signed   By: Constance Holster M.D.   On: 11/04/2019 00:53    ____________________________________________   PROCEDURES  Procedure(s) performed (including Critical Care):  Procedures  CRITICAL CARE Performed by: Paulette Blanch   Total critical care time: 30 minutes  Critical care time was exclusive of separately billable procedures and treating other patients.  Critical care was necessary to treat or prevent imminent or life-threatening deterioration.  Critical care was time spent personally by me on the following activities: development of treatment plan with patient and/or surrogate as well as nursing, discussions with consultants, evaluation of patient's response to treatment, examination of patient, obtaining history from patient or surrogate, ordering and performing treatments and interventions, ordering and review of laboratory studies, ordering and review of radiographic studies, pulse oximetry and re-evaluation of patient's  condition.  ____________________________________________   INITIAL IMPRESSION / ASSESSMENT AND PLAN / ED COURSE  As part of my medical decision making, I reviewed the following data within the [place] notes reviewed and incorporated, Labs reviewed, Old chart reviewed, Discussed with admitting physician and Notes from prior ED visits     49 year old female who presents with hyperglycemia, polydipsia and polyuria.  Differential diagnosis includes but is not limited to infectious, metabolic etiologies, diabetes, DKA, etc.  Laboratory data demonstrates pseudohyponatremia, hyperglycemia with elevated anion gap.  Will add beta hydroxybutyrate and hemoglobin A1c as patient denies history of diabetes.  Obtain chest x-ray to evaluate pneumonia.  Patient received 1 L IV fluids prior to being  roomed.  Will administer IV Rocephin for UTI.  Discussed with hospitalist service for admission for DKA.      ____________________________________________   FINAL CLINICAL IMPRESSION(S) / ED DIAGNOSES  Final diagnoses:  Hyperglycemia  Diabetic ketoacidosis without coma associated with drug or chemical induced diabetes mellitus ([place])  Hyponatremia  Urinary tract infection without hematuria, site unspecified     ED Discharge Orders    None      *Please note:  Giabella Duhart was evaluated in Emergency Department on 11/04/2019 for the symptoms described in the history of present illness. She was evaluated in the context of the global COVID-19 pandemic, which necessitated consideration that the patient might be at risk for infection with the SARS-CoV-2 virus that causes COVID-19. Institutional protocols and algorithms that pertain to the evaluation of patients at risk for COVID-19 are in a state of rapid change based on information released by regulatory bodies including the CDC and federal and state organizations. These policies and algorithms were followed during the  patient's care in the ED.  Some ED evaluations and interventions may be delayed as a result of limited staffing during and the pandemic.*   Note:  This document was prepared using Dragon voice recognition software and may include unintentional dictation errors.   Paulette Blanch, MD 11/04/19 [phone]

## 2019-11-04 NOTE — Progress Notes (Signed)
Inpatient Diabetes Program Recommendations  AACE/ADA: New Consensus Statement on Inpatient Glycemic Control (2015)  Target Ranges:  Prepandial:   less than 140 mg/dL      Peak postprandial:   less than 180 mg/dL (1-2 hours)      Critically ill patients:  140 - 180 mg/dL   Results for Paula Gilmore, Paula Gilmore (MRN [id number]) as of 11/04/2019 07:44  Ref. Range 11/03/2019 20:03  Glucose Latest Ref Range: 70 - 99 mg/dL [facility])   Results for Paula Gilmore, Paula Gilmore (MRN [id number]) as of 11/04/2019 07:44  Ref. Range 11/03/2019 19:55 11/03/2019 23:07 11/03/2019 23:27 11/04/2019 01:46 11/04/2019 03:00 11/04/2019 04:17 11/04/2019 05:16 11/04/2019 06:37  Glucose-Capillary Latest Ref Range: 70 - 99 mg/dL >600 (HH) >600 (HH) >600 (HH) 597 (HH)  IV Insulin Drip Started 412 (H)  IV Insulin Drip 367 (H)  IV Insulin Drip 373 (H)  IV Insulin Drip 322 (H)  IV Insulin Drip    Admit with: DKA/ New onset type 2 diabetes mellitus  Presents with blurred vision, dry mouth, frequent urination and increased thirst that started after completing a Z-Pak and steroids for pneumonia   Current Orders: IV Insulin Drip    PCP: Lawernce Pitts, FNP--Has Medicaid coverage  Hemoglobin A1c level pending  Started on the IV Insulin Drip around 2am today.  Will order educational materials for this patient and plan visit with her to discuss new diagnosis     --Will follow patient during hospitalization--  Wyn Quaker RN, MSN, CDE Diabetes Coordinator Inpatient Glycemic Control Team Team Pager: [phone] (8a-5p)

## 2019-11-04 NOTE — ED Notes (Signed)
Gown changed

## 2019-11-04 NOTE — ED Notes (Signed)
Eating lunch tray.

## 2019-11-04 NOTE — ED Notes (Addendum)
This RN at bedside. Pt out of bed. Pt assisted back in bed stating, "I needed to get up my feet and the purewick were hurting me." This RN explained to pt to hit the call bell if she needed anything to ensure she was safe getting out of bed. Pt refusing to have purewick put back in place. Pt hooked back up to monitor, IVs secured, bed alarm activated.

## 2019-11-04 NOTE — ED Notes (Signed)
Lab at bedside

## 2019-11-04 NOTE — Progress Notes (Signed)
PHARMACIST - PHYSICIAN COMMUNICATION  CONCERNING:  Enoxaparin (Lovenox) for DVT Prophylaxis    RECOMMENDATION: Patient was prescribed enoxaprin 40mg  q24 hours for VTE prophylaxis.   Filed Weights   11/03/19 1955  Weight: (!) 181.4 kg (400 lb)    Body mass index is 64.56 kg/m.  Estimated Creatinine Clearance: 118.5 mL/min (by C-G formula based on SCr of 0.98 mg/dL).   Based on [place] patient is candidate for enoxaparin 40mg  every 12 hours based on BMI being >40.   DESCRIPTION: Pharmacy has adjusted enoxaparin dose per [facility] policy.  Patient is now receiving enoxaparin 40mg  every 12 hours    Pernell Dupre, PharmD, BCPS Clinical Pharmacist 11/04/2019 7:08 AM

## 2019-11-04 NOTE — ED Notes (Signed)
Phlebotomy called to collect lab.

## 2019-11-04 NOTE — ED Notes (Signed)
This RN at bedside. Pt calling out in pain. Pt stating she thinks she sprained her ankle. Pt tearful and rubbing right ankle. This RN stating to pt she could still get the tylenol from earlier. This RN also stating to pt MD would be notified.

## 2019-11-04 NOTE — Progress Notes (Signed)
Pt seen and examined at bedside. She waas admitted earlier this am by Dr Damita Dunnings, please see her note for detailed H&P.    49 year old lady with prior hypertension, MORBID OBESITY, chronic pain, presents to ED with blurred vision, frequent urination, increased thirst, was found to be in DKA on arrival to ED.  She was admitted for evaluation and management of DKA and new onset DM.    Plan:  1. Resume Insulin gtt until AG closes, beta butyric acid normalizes.   Hosie Poisson MD.

## 2019-11-04 NOTE — ED Notes (Signed)
Pt assisted to the bathroom.

## 2019-11-04 NOTE — H&P (Signed)
History and Physical    Paula Gilmore RJJ:[id number] DOB: 1970-07-28 DOA: 11/04/2019  PCP: Lawernce Pitts, FNP   Patient coming from: Home  I have personally briefly reviewed patient's old medical records in [place]  Chief Complaint: Weakness, increased thirst, frequent urination, blurred vision by 3 days  HPI: Paula Gilmore is a 49 y.o. female with medical history significant for hypertension, chronic osteoarthritic pain, morbid obesity who presented to the ED with a 3-day history of blurred vision, dry mouth, frequent urination and increased thirst that started after completing a Z-Pak and steroids for pneumonia.  Denies chest pain, shortness of breath, cough fever or chills.  Denies abdominal pain, nausea or vomiting or change in bowel habits and denies dysuria. ED Course: On arrival she had a low-grade temperature of 99.4, mild tachycardia of 106 with otherwise normal vitals.  Blood work significant for blood sugar of 757 with an anion gap of 18.  Venous pH 7.44.  Creatinine 1.05 above baseline of 0.66.  Urinalysis mildly abnormal.  Chest x-ray showed prominent interstitial lung markings but no acute findings.  Patient started on IV insulin.  Also given a dose of Rocephin for possible UTI.  Hospitalist consulted for admission.   Review of Systems: As per HPI otherwise all other systems on review of systems negative.    Past Medical History:  Diagnosis Date  . Acid reflux   . Arthritis   . Cancer ([place])    Cervical  . Cardiomyopathy ([place])   . COPD (chronic obstructive pulmonary disease) ([place])   . Fibromyalgia   . Hypertension   . Migraine   . Mitral valve regurgitation     Past Surgical History:  Procedure Laterality Date  . CESAREAN SECTION  2009     reports that she has quit smoking. She has never used smokeless tobacco. She reports that she does not drink alcohol and does not use drugs.  Allergies  Allergen Reactions  . Cymbalta [Duloxetine Hcl]  Other (See Comments)    V-tach  . Montelukast Hives  . Tizanidine Hives  . Savella [Milnacipran Hcl] Palpitations    History reviewed. No pertinent family history.    Prior to Admission medications   Medication Sig Start Date End Date Taking? Authorizing Provider  acetaminophen (TYLENOL) 325 MG tablet Take 1,000 mg by mouth every 6 (six) hours.    [provider]  Aspirin-Acetaminophen-Caffeine (GOODYS EXTRA STRENGTH) [phone] MG PACK Take 1 packet by mouth as needed.    [provider]  azithromycin (ZITHROMAX Z-PAK) 250 MG tablet Take 2 tablets (500 mg) on  Day 1,  followed by 1 tablet (250 mg) once daily on Days 2 through 5. 11/26/18   Laban Emperor, PA-C  hydrochlorothiazide (HYDRODIURIL) 25 MG tablet Take 25 mg by mouth daily.    [provider]  ibuprofen (ADVIL,MOTRIN) 200 MG tablet Take 800 mg by mouth every 6 (six) hours.    [provider]  loperamide (IMODIUM) 2 MG capsule Take 2 mg by mouth as needed.    [provider]  methocarbamol (ROBAXIN) 750 MG tablet Take 750 mg by mouth 4 (four) times daily.    [provider]  naproxen (NAPROSYN) 250 MG tablet Take 250 mg by mouth 2 (two) times daily.    [provider]  ondansetron (ZOFRAN ODT) 4 MG disintegrating tablet Take 1 tablet (4 mg total) by mouth every 6 (six) hours as needed for nausea or vomiting. 10/08/18   Delman Kitten, MD  predniSONE (DELTASONE) 50 MG tablet Take 1 tablet per day 11/26/18   Laban Emperor, PA-C  SUCRALFATE PO Take 1 tablet by mouth 4 (four) times daily.    [provider]    Physical Exam: Vitals:   11/03/19 1917 11/03/19 1955 11/03/19 2333  BP:  110/88 124/90  Pulse:  (!) 106 92  Resp:  16 18  Temp:  99.4 F (37.4 C) 98.7 F (37.1 C)  TempSrc:  Oral Oral  SpO2: 96% 93% 99%  Weight:  (!) 181.4 kg   Height:  5\' 6"  (1.676 m)      Vitals:   11/03/19 1917 11/03/19 1955 11/03/19 2333  BP:  110/88 124/90  Pulse:  (!)  106 92  Resp:  16 18  Temp:  99.4 F (37.4 C) 98.7 F (37.1 C)  TempSrc:  Oral Oral  SpO2: 96% 93% 99%  Weight:  (!) 181.4 kg   Height:  5\' 6"  (1.676 m)       Constitutional: Alert and oriented x 3 . Not in any apparent distress HEENT:      Head: Normocephalic and atraumatic.         Eyes: PERLA, EOMI, Conjunctivae are normal. Sclera is non-icteric.       Mouth/Throat: Mucous membranes are moist.       Neck: Supple with no signs of meningismus. Cardiovascular: Regular rate and rhythm. No murmurs, gallops, or rubs. 2+ symmetrical distal pulses are present . No JVD. No LE edema Respiratory: Respiratory effort normal .Lungs sounds clear bilaterally. No wheezes, crackles, or rhonchi.  Gastrointestinal: Soft, non tender, and non distended with positive bowel sounds. No rebound or guarding. Genitourinary: No CVA tenderness. Musculoskeletal: Nontender with normal range of motion in all extremities. No cyanosis, or erythema of extremities. Neurologic:  Face is symmetric. Moving all extremities. No gross focal neurologic deficits . Skin: Skin is warm, dry.  No rash or ulcers Psychiatric: Mood and affect are normal    Labs on Admission: I have personally reviewed following labs and imaging studies  CBC: Recent Labs  Lab 11/03/19 2003  WBC 11.9*  HGB 14.4  HCT 41.0  MCV 85.8  PLT 811   Basic Metabolic Panel: Recent Labs  Lab 11/03/19 2003  NA 126*  K 4.7  CL 86*  CO2 22  GLUCOSE 757*  BUN 27*  CREATININE 1.05*  CALCIUM 10.1   GFR: Estimated Creatinine Clearance: 110.6 mL/min (A) (by C-G formula based on SCr of 1.05 mg/dL (H)). Liver Function Tests: No results for input(s): AST, ALT, ALKPHOS, BILITOT, PROT, ALBUMIN in the last 168 hours. No results for input(s): LIPASE, AMYLASE in the last 168 hours. No results for input(s): AMMONIA in the last 168 hours. Coagulation Profile: No results for input(s): INR, PROTIME in the last 168 hours. Cardiac Enzymes: No results  for input(s): CKTOTAL, CKMB, CKMBINDEX, TROPONINI in the last 168 hours. BNP (last 3 results) No results for input(s): PROBNP in the last 8760 hours. HbA1C: No results for input(s): HGBA1C in the last 72 hours. CBG: Recent Labs  Lab 11/03/19 1955 11/03/19 2307 11/03/19 2327  GLUCAP >600* >600* >600*   Lipid Profile: No results for input(s): CHOL, HDL, LDLCALC, TRIG, CHOLHDL, LDLDIRECT in the last 72 hours. Thyroid Function Tests: No results for input(s): TSH, T4TOTAL, FREET4, T3FREE, THYROIDAB in the last 72 hours. Anemia Panel: No results for input(s): VITAMINB12, FOLATE, FERRITIN, TIBC, IRON, RETICCTPCT in the last 72 hours. Urine analysis:    Component Value Date/Time   COLORURINE  STRAW (A) 11/03/2019 2003   APPEARANCEUR HAZY (A) 11/03/2019 2003   LABSPEC 1.027 11/03/2019 2003   PHURINE 5.0 11/03/2019 2003   GLUCOSEU >=500 (A) 11/03/2019 2003   HGBUR MODERATE (A) 11/03/2019 2003   BILIRUBINUR NEGATIVE 11/03/2019 2003   KETONESUR 5 (A) 11/03/2019 2003   PROTEINUR NEGATIVE 11/03/2019 2003   NITRITE NEGATIVE 11/03/2019 2003   LEUKOCYTESUR SMALL (A) 11/03/2019 2003    Radiological Exams on Admission: DG Chest Port 1 View  Result Date: 11/04/2019 CLINICAL DATA:  Recent pneumonia EXAM: PORTABLE CHEST 1 VIEW COMPARISON:  11/26/2018 FINDINGS: Prominent interstitial lung markings are again noted. There is no pneumothorax or large pleural effusion. No focal infiltrate. The heart size is stable from prior study. There is no acute osseous abnormality. IMPRESSION: Unchanged bilateral bronchitic changes. Electronically Signed   By: Constance Holster M.D.   On: 11/04/2019 00:53     Assessment/Plan 49 year old female with history of hypertension, chronic osteoarthritic pain, fibromyalgia, morbid obesity presenting with blurred vision, dry mouth, frequent urination and increased thirst that started after completing a Z-Pak and steroids for pneumonia.  Blood sugar 757 with anion gap of  18.     DKA (diabetic ketoacidosis) ([place])   New onset type 2 diabetes mellitus ([place]) -Continue insulin infusion via Endo tool -IV hydration, IV potassium per Endo tool protocol -Diabetic educator consult  AKI -Creatinine 1.05 above baseline of 0.66 -Should improve with IV fluids.  Continue to monitor    Morbid obesity with BMI of 60.0-69.9, adult (HCC) -Complicating factor to overall prognosis and care    Chronic pain -Continue Tylenol and Robaxin as needed -We will avoid all NSAIDs for now until AKI improved    HTN (hypertension) -We will hold HydroDIURIL until AKI improved.    UTI (urinary tract infection) -Very mild pyuria on urinalysis. -Patient got a dose of Rocephin in the ER -Follow urine culture    Sleep apnea -CPAP if desired    DVT prophylaxis: Lovenox  Code Status: full code  Family Communication:  none  Disposition Plan: Back to previous home environment Consults called: none  Status:At the time of admission, it appears that the appropriate admission status for this patient is INPATIENT. This is judged to be reasonable and necessary in order to provide the required intensity of service to ensure the patient's safety given the presenting symptoms, physical exam findings, and initial radiographic and laboratory data in the context of their  Comorbid conditions.   Patient requires inpatient status due to high intensity of service, high risk for further deterioration and high frequency of surveillance required.   I certify that at the point of admission it is my clinical judgment that the patient will require inpatient hospital care spanning beyond [place] MD [facility]     11/04/2019, 1:20 AM

## 2019-11-04 NOTE — ED Notes (Signed)
Pt's bed linens changed per pt request. Pt denies any further needs at this time

## 2019-11-04 NOTE — ED Notes (Signed)
This RN messaged admitting floor coverage due to pt's CBG trending up from past 3 draws and insulin drip rate increasing. Pt remains on LR and D5LR continuous infusion. No new orders at this time.

## 2019-11-04 NOTE — ED Notes (Signed)
This RN at bedside to answer call bell. Pt already out of bed and on toilet. Pt stating, "Sorry I could not wait, I had to have a bowel movement." This RN ensured IVs were still in place. This RN stressed the importance to remain in bed until assistance arrives. This RN stressing the importance of safe transfer in and out bed. Pt apologetic. This RN assisted pt back in bed.

## 2019-11-04 NOTE — ED Notes (Signed)
Pt assisted to the bathroom at this time.

## 2019-11-04 NOTE — Progress Notes (Addendum)
Admit with: DKA/ New onset type 2 diabetes mellitus  Presents with blurred vision, dry mouth, frequent urination and increased thirst that started after completing a Z-Pak and steroids for pneumonia  Current Insulin Orders: IV Insulin Drip  PCP: Lawernce Pitts, FNP--Has Medicaid coverage  Hemoglobin A1c level pending    MD- Note CBGs still running in the high 200s to 300s--Pt has been allowed to start PO diet so this is likely causing continued hyperglycemia.  Waiting on Beta-Hydroxybutyric acid level  Recommend we keep pt on the IV Insulin Drip until her Anion Gap is closer to 12 or her Beta-Hydroxybutyric acid level is closer to normal.  Once this has been achieved, may want to make sure pt's CBGs have dropped below 200 to transition.  Once pt allowed to transition to SQ Insulin, could try the following: 1. Start Lantus 25 units BID (~0.3 units/kg)--Make sure 1st dose Lantus given at least 1 hour prior to d/c of the IV Insulin Drip  2. Start Novolog Resistant Correction Scale/ SSI (0-20 units) TID AC + HS  3. Start Novolog 6 units TID with meals for meal coverage   Met w/ pt at bedside down in the ED.   Pt was groggy but easily awoke to my voice and was able to stay awake and hold a conversation.  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  States her Bipap is broken and she is awaiting Sleep study to get a new bipap.  Also states she has acid reflux issues but can't see GI MD until December.  States her 2 brothers and mother have diabetes.  Told me she developed blurry vision, excessive thirst, frequent trips to urinate (urinating 4-6 times per hour) the last few days she was finishing her Z pak and Prednisone.  Told me she looked up the symptoms of diabetes and called her PCP.  PCP told her to go to urgent care but pt stated she doesn't have a car.  Brother came to her house and checked her  CBG with his meter--reading said "high".  Brother was going to take pt to Urgent Care but couldn't move pt so instead they called 911 and EMS brought pt to the ED for treatment.  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  Spoke with pt about new diagnosis.  Explained what an A1C is, basic pathophysiology of DM Type 2, basic home care, basic diabetes diet nutrition principles, importance of checking CBGs and maintaining good CBG control to prevent long-term and short-term complications.  Reviewed signs and symptoms of hyperglycemia and hypoglycemia and how to treat hypoglycemia at home.  Also reviewed blood sugar goals and A1c goals for home.  Also discussed with patient diagnosis of DKA (pathophysiology), treatment of DKA, lab results, and transition plan to SQ insulin regimen.  Also discussed DM diet information with patient.  Encouraged patient to avoid beverages with sugar (regular soda, sweet tea, lemonade, fruit juice) and to consume mostly water.  Discussed what foods contain carbohydrates and how carbohydrates affect the body's blood sugar levels.  Encouraged patient to be careful with her portion sizes (especially grains, starchy vegetables, and fruits).    RNs to provide ongoing basic DM education at bedside with this patient.  Will order educational booklet, insulin starter kit, and RD  consult for pt.  Will ask RNs to please allow pt to administer all injections for practice.  Have attached multiple educational documents to pt's d/c paperwork as well.  Educated patient on insulin pen use at home.  Reviewed all steps of insulin pen including attachment of needle, 2-unit air shot, dialing up dose, giving injection, rotation of injection sites, removing needle, disposal of sharps, storage of unused insulin, disposal of insulin etc.  Patient able to provide successful return demonstration.  Reviewed troubleshooting  with insulin pen.      --Will follow patient during hospitalization--  Wyn Quaker RN, MSN, CDE Diabetes Coordinator Inpatient Glycemic Control Team Team Pager: [phone] (8a-5p)

## 2019-11-04 NOTE — ED Notes (Signed)
Pt given blanket and lights dimmed for pt comfort

## 2019-11-04 NOTE — ED Notes (Signed)
Pt has removed monitoring cords and BP cuff at this time. Pt stating she does not want them on there right now. This RN stressing the importance of pt being monitored. Pt refusing to be hooked back up at this time

## 2019-11-04 NOTE — Progress Notes (Signed)
Refused cpap for now.

## 2019-11-05 ENCOUNTER — Inpatient Hospital Stay: Payer: Medicaid Other

## 2019-11-05 DIAGNOSIS — I10 Essential (primary) hypertension: Secondary | ICD-10-CM

## 2019-11-05 DIAGNOSIS — E871 Hypo-osmolality and hyponatremia: Secondary | ICD-10-CM

## 2019-11-05 DIAGNOSIS — E1110 Type 2 diabetes mellitus with ketoacidosis without coma: Secondary | ICD-10-CM | POA: Diagnosis not present

## 2019-11-05 DIAGNOSIS — Z6841 Body Mass Index (BMI) 40.0 and over, adult: Secondary | ICD-10-CM

## 2019-11-05 LAB — BASIC METABOLIC PANEL
Anion gap: 14 (ref 5–15)
Anion gap: 14 (ref 5–15)
BUN: 11 mg/dL (ref 6–20)
BUN: 11 mg/dL (ref 6–20)
BUN: 8 mg/dL (ref 6–20)
CO2: 26 mmol/L (ref 22–32)
CO2: 18 mmol/L — ABNORMAL LOW (ref 22–32)
CO2: 23 mmol/L (ref 22–32)
Calcium: 9.1 mg/dL (ref 8.9–10.3)
Calcium: 8.8 mg/dL — ABNORMAL LOW (ref 8.9–10.3)
Calcium: 8.9 mg/dL (ref 8.9–10.3)
Chloride: 93 mmol/L — ABNORMAL LOW (ref 98–111)
Chloride: 96 mmol/L — ABNORMAL LOW (ref 98–111)
Chloride: 96 mmol/L — ABNORMAL LOW (ref 98–111)
Creatinine, Ser: 0.75 mg/dL (ref 0.44–1.00)
Creatinine, Ser: 0.74 mg/dL (ref 0.44–1.00)
Creatinine, Ser: 0.77 mg/dL (ref 0.44–1.00)
GFR, Estimated: >60 mL/min (ref >60)
GFR, Estimated: >60 mL/min (ref >60)
GFR, Estimated: >60 mL/min (ref >60)
Glucose, Bld: 215 mg/dL — ABNORMAL HIGH (ref 70–99)
Glucose, Bld: 265 mg/dL — ABNORMAL HIGH (ref 70–99)
Glucose, Bld: 243 mg/dL — ABNORMAL HIGH (ref 70–99)
Potassium: 3.4 mmol/L — ABNORMAL LOW (ref 3.5–5.1)
Potassium: 3.8 mmol/L (ref 3.5–5.1)
Potassium: 3.8 mmol/L (ref 3.5–5.1)
Sodium: 133 mmol/L — ABNORMAL LOW (ref 135–145)
Sodium: UNDETERMINED mmol/L (ref 135–145)
Sodium: 133 mmol/L — ABNORMAL LOW (ref 135–145)

## 2019-11-05 LAB — GLUCOSE, CAPILLARY
Glucose-Capillary: 214 mg/dL — ABNORMAL HIGH (ref 70–99)
Glucose-Capillary: 223 mg/dL — ABNORMAL HIGH (ref 70–99)
Glucose-Capillary: 273 mg/dL — ABNORMAL HIGH (ref 70–99)
Glucose-Capillary: 317 mg/dL — ABNORMAL HIGH (ref 70–99)
Glucose-Capillary: 309 mg/dL — ABNORMAL HIGH (ref 70–99)
Glucose-Capillary: 249 mg/dL — ABNORMAL HIGH (ref 70–99)
Glucose-Capillary: 218 mg/dL — ABNORMAL HIGH (ref 70–99)
Glucose-Capillary: 227 mg/dL — ABNORMAL HIGH (ref 70–99)
Glucose-Capillary: 241 mg/dL — ABNORMAL HIGH (ref 70–99)

## 2019-11-05 LAB — HEMOGLOBIN A1C
Hgb A1c MFr Bld: 11.4 % — ABNORMAL HIGH (ref 4.8–5.6)
Mean Plasma Glucose: 280 mg/dL

## 2019-11-05 MED ORDER — INSULIN GLARGINE 100 UNIT/ML ~~LOC~~ SOLN
20.0000 [IU] | Freq: Two times a day (BID) | SUBCUTANEOUS | Status: DC
  Administered 2019-11-05 (×2): 20 [IU] via SUBCUTANEOUS
  Filled 2019-11-05 (×4): qty 0.2

## 2019-11-05 MED ORDER — FLUCONAZOLE 100 MG PO TABS
100.0000 mg | ORAL_TABLET | Freq: Every day | ORAL | Status: AC
  Administered 2019-11-05 – 2019-11-09 (×5): 100 mg via ORAL
  Filled 2019-11-05 (×5): qty 1

## 2019-11-05 MED ORDER — INSULIN STARTER KIT- PEN NEEDLES (ENGLISH)
1.0000 | Freq: Once | Status: DC
  Filled 2019-11-05: qty 1

## 2019-11-05 MED ORDER — INSULIN ASPART 100 UNIT/ML ~~LOC~~ SOLN
0.0000 [IU] | Freq: Every day | SUBCUTANEOUS | Status: DC
  Administered 2019-11-05: 2 [IU] via SUBCUTANEOUS
  Administered 2019-11-06: 4 [IU] via SUBCUTANEOUS
  Administered 2019-11-07 – 2019-11-08 (×2): 2 [IU] via SUBCUTANEOUS
  Filled 2019-11-05 (×4): qty 1

## 2019-11-05 MED ORDER — CALCIUM CARBONATE ANTACID 500 MG PO CHEW
1.0000 | CHEWABLE_TABLET | Freq: Four times a day (QID) | ORAL | Status: DC | PRN
  Administered 2019-11-05 – 2019-11-06 (×3): 200 mg via ORAL
  Filled 2019-11-05 (×3): qty 1

## 2019-11-05 MED ORDER — INSULIN ASPART 100 UNIT/ML ~~LOC~~ SOLN
4.0000 [IU] | Freq: Three times a day (TID) | SUBCUTANEOUS | Status: DC
  Administered 2019-11-05 – 2019-11-06 (×3): 4 [IU] via SUBCUTANEOUS
  Filled 2019-11-05 (×3): qty 1

## 2019-11-05 MED ORDER — SODIUM CHLORIDE 0.9 % IV SOLN: INTRAVENOUS | Status: DC

## 2019-11-05 MED ORDER — POTASSIUM CHLORIDE CRYS ER 20 MEQ PO TBCR
40.0000 meq | EXTENDED_RELEASE_TABLET | Freq: Once | ORAL | Status: AC
  Administered 2019-11-05: 40 meq via ORAL
  Filled 2019-11-05: qty 2

## 2019-11-05 MED ORDER — LIVING WELL WITH DIABETES BOOK
Freq: Once | Status: DC
  Filled 2019-11-05: qty 1

## 2019-11-05 MED ORDER — SODIUM CHLORIDE 0.9 % IV SOLN
1.0000 g | INTRAVENOUS | Status: DC
  Administered 2019-11-05 – 2019-11-06 (×2): 1 g via INTRAVENOUS
  Filled 2019-11-05: qty 1
  Filled 2019-11-05: qty 10
  Filled 2019-11-05: qty 1

## 2019-11-05 MED ORDER — INSULIN ASPART 100 UNIT/ML ~~LOC~~ SOLN
0.0000 [IU] | Freq: Three times a day (TID) | SUBCUTANEOUS | Status: DC
  Administered 2019-11-05: 5 [IU] via SUBCUTANEOUS
  Administered 2019-11-06: 8 [IU] via SUBCUTANEOUS
  Administered 2019-11-06: 15 [IU] via SUBCUTANEOUS
  Administered 2019-11-06: 11 [IU] via SUBCUTANEOUS
  Administered 2019-11-07: 15 [IU] via SUBCUTANEOUS
  Administered 2019-11-07: 11 [IU] via SUBCUTANEOUS
  Administered 2019-11-07: 5 [IU] via SUBCUTANEOUS
  Administered 2019-11-08: 8 [IU] via SUBCUTANEOUS
  Administered 2019-11-08: 3 [IU] via SUBCUTANEOUS
  Administered 2019-11-08 – 2019-11-09 (×2): 8 [IU] via SUBCUTANEOUS
  Administered 2019-11-09: 3 [IU] via SUBCUTANEOUS
  Administered 2019-11-09: 5 [IU] via SUBCUTANEOUS
  Administered 2019-11-10: 11 [IU] via SUBCUTANEOUS
  Administered 2019-11-10: 5 [IU] via SUBCUTANEOUS
  Filled 2019-11-05 (×15): qty 1

## 2019-11-05 MED ORDER — PANTOPRAZOLE SODIUM 40 MG PO TBEC
40.0000 mg | DELAYED_RELEASE_TABLET | Freq: Two times a day (BID) | ORAL | Status: DC
  Administered 2019-11-05 – 2019-11-09 (×9): 40 mg via ORAL
  Filled 2019-11-05 (×9): qty 1

## 2019-11-05 NOTE — ED Notes (Signed)
Lunch tray given at this time.  

## 2019-11-05 NOTE — Progress Notes (Signed)
Admit with: DKA/ New onset type 2 diabetes mellitus  Presents with blurred vision, dry mouth, frequent urination and increased thirst that started after completing a Z-Pak and steroids for pneumonia  Current Insulin Orders: IV Insulin Drip  PCP: Lawernce Pitts, FNP--Has Medicaid coverage  Hemoglobin A1c 11.4%    MD- Note IV insulin gtt rates 10 units/hour.  Once pt allowed to transition to SQ Insulin, could try the following:  1. Start Lantus 25 units BID (~0.3 units/kg)--Make sure 1st dose Lantus given at least 2 hours prior to d/c of the IV Insulin Drip  2. Start Novolog Resistant Correction Scale/ SSI (0-20 units) TID AC + HS  3. Start Novolog 6 units TID with meals for meal coverage  --Will follow patient during hospitalization--  Tama Headings RN, MSN, BC-ADM Inpatient Diabetes Coordinator Team Pager [phone] (8a-5p)

## 2019-11-05 NOTE — ED Notes (Signed)
Lab called and stated they will get repeat BMP when pt gets to floor.

## 2019-11-05 NOTE — ED Notes (Signed)
Pt ambulatory to the restroom at this time. Pt will not keep cardiac monitor.

## 2019-11-05 NOTE — Progress Notes (Signed)
PROGRESS NOTE    Paula Gilmore  GYF:[id number] DOB: Jul 24, 1970 DOA: 11/04/2019 PCP: Lawernce Pitts, FNP    Chief Complaint  Patient presents with  . Hyperglycemia    Brief Narrative:  49 year old lady with prior hypertension, MORBID OBESITY, chronic pain, presents to ED with blurred vision, frequent urination, increased thirst, and on for a few weeks was found to be in DKA on arrival to ED.  She was admitted for evaluation and management of DKA and new onset DM.    Assessment & Plan:   Principal Problem:   DKA (diabetic ketoacidosis) ([place]) Active Problems:   Sleep apnea   Morbid obesity with BMI of 60.0-69.9, adult (HCC)   Chronic pain   HTN (hypertension)   UTI (urinary tract infection)   New onset type 2 diabetes mellitus ([place])    DKA Improved after insulin gtt.  Transition to Lantus 20 units twice daily, 4 units of NovoLog 3 times daily before meals and sliding scale insulin. CBG (last 3)  Recent Labs    11/05/19 0946 11/05/19 1111 11/05/19 1159  GLUCAP 273* 317* 309*   Anion gap is closed bicarb within normal limits. Diabetes education/coordinator consulted and recommendations given.    Essential hypertension Blood pressure parameters are optimal at this time.    Frequent urination and abnormal UA, will treat for urinary tract infection with IV Rocephin and follow urine cultures.    Morbid obesity Recommend referral to bariatric surgery for weight loss.. Body mass index is 64.56 kg/m. Dietary consulted.    Blurry vision and dizziness for a few weeks now No falls or loss of consciousness MRI of the brain without contrast ordered for further evaluation.    Right ankle sprain X-rays of the ankle negative for fracture show some soft tissue swelling.    Hypokalemia Replaced.    Hyponatremia probably from hyperglycemia.    Leukocytosis probably from UTI/reactive.       DVT prophylaxis: (Lovenox) Code Status: (Full  code.  Family Communication: none at bedside.  Disposition:   Status is: Inpatient  Remains inpatient appropriate because:Ongoing diagnostic testing needed not appropriate for outpatient work up, IV treatments appropriate due to intensity of illness or inability to take PO and Inpatient level of care appropriate due to severity of illness   Dispo: The patient is from: Home              Anticipated d/c is to: Home              Anticipated d/c date is: 2 days              Patient currently is not medically stable to d/c.       Consultants:   None.    Procedures: MRI brain without contrast.   Antimicrobials: none.   Subjective: Reports having blurry vision, no nausea, vomiting.  Some gerd.   Objective: Vitals:   11/05/19 0212 11/05/19 0748 11/05/19 1103 11/05/19 1227  BP: (!) 152/73 (!) 126/38 110/65 138/88  Pulse: (!) 102 99  (!) 101  Resp: '18 20  20  ' Temp:  98.5 F (36.9 C)    TempSrc:  Oral    SpO2: 93% 96%  99%  Weight:      Height:        Intake/Output Summary (Last 24 hours) at 11/05/2019 1256 Last data filed at 11/05/2019 0829 Gross per 24 hour  Intake 459.23 ml  Output --  Net 459.23 ml   [employer]  11/03/19 1955  Weight: (!) 181.4 kg    Examination:  General exam: Appears calm and comfortable  Respiratory system: Clear to auscultation. Respiratory effort normal. Cardiovascular system: S1 & S2 heard, RRR. No JVD, . No pedal edema. Gastrointestinal system: Abdomen is nondistended, soft and nontender.. Normal bowel sounds heard. Central nervous system: Alert and oriented. No focal neurological deficits. Extremities: Symmetric 5 x 5 power. Skin: No rashes, lesions or ulcers Psychiatry:  Mood & affect appropriate.     Data Reviewed: I have personally reviewed following labs and imaging studies  CBC: Recent Labs  Lab 11/03/19 2003 11/04/19 0136  WBC 11.9* 12.1*  HGB 14.4 13.8  HCT 41.0 39.4  MCV 85.8 86.0  PLT 298 284    Basic  Metabolic Panel: Recent Labs  Lab 11/04/19 0647 11/04/19 0937 11/04/19 1505 11/04/19 2218 11/05/19 0732  NA 131* 133* 130* 132* 133*  K 3.3* 3.1* 3.6 3.5 3.4*  CL 89* 89* 90* 93* 93*  CO2 '25 26 24 23 26  ' GLUCOSE 285* 221* 268* 187* 215*  BUN '19 17 14 14 11  ' CREATININE 0.70 0.80 0.78 0.78 0.75  CALCIUM 9.1 9.2 9.2 9.4 9.1  MG  --   --   --  2.0  --     GFR: Estimated Creatinine Clearance: 145.2 mL/min (by C-G formula based on SCr of 0.75 mg/dL).  Liver Function Tests: No results for input(s): AST, ALT, ALKPHOS, BILITOT, PROT, ALBUMIN in the last 168 hours.  CBG: Recent Labs  Lab 11/05/19 0210 11/05/19 0731 11/05/19 0946 11/05/19 1111 11/05/19 1159  GLUCAP 214* 223* 273* 317* 309*     Recent Results (from the past 240 hour(s))  Respiratory Panel by RT PCR (Flu A&B, Covid) - Nasopharyngeal Swab     Status: None   Collection Time: 11/04/19 12:44 AM   Specimen: Nasopharyngeal Swab  Result Value Ref Range Status   SARS Coronavirus 2 by RT PCR NEGATIVE NEGATIVE Final    Comment: (NOTE) SARS-CoV-2 target nucleic acids are NOT DETECTED.  The SARS-CoV-2 RNA is generally detectable in upper respiratoy specimens during the acute phase of infection. The lowest concentration of SARS-CoV-2 viral copies this assay can detect is 131 copies/mL. A negative result does not preclude SARS-Cov-2 infection and should not be used as the sole basis for treatment or other patient management decisions. A negative result may occur with  improper specimen collection/handling, submission of specimen other than nasopharyngeal swab, presence of viral mutation(s) within the areas targeted by this assay, and inadequate number of viral copies (<131 copies/mL). A negative result must be combined with clinical observations, patient history, and epidemiological information. The expected result is Negative.  Fact Sheet for Patients:  PinkCheek.be  Fact Sheet for  Healthcare Providers:  GravelBags.it  This test is no t yet approved or cleared by the [place] FDA and  has been authorized for detection and/or diagnosis of SARS-CoV-2 by FDA under an Emergency Use Authorization (EUA). This EUA will remain  in effect (meaning this test can be used) for the duration of the COVID-19 declaration under Section 564(b)(1) of the Act, 21 U.S.C. section 360bbb-3(b)(1), unless the authorization is terminated or revoked sooner.     Influenza A by PCR NEGATIVE NEGATIVE Final   Influenza B by PCR NEGATIVE NEGATIVE Final    Comment: (NOTE) The Xpert Xpress SARS-CoV-2/FLU/RSV assay is intended as an aid in  the diagnosis of influenza from Nasopharyngeal swab specimens and  should not be used as a sole basis  for treatment. Nasal washings and  aspirates are unacceptable for Xpert Xpress SARS-CoV-2/FLU/RSV  testing.  Fact Sheet for Patients: PinkCheek.be  Fact Sheet for Healthcare Providers: GravelBags.it  This test is not yet approved or cleared by the [place] FDA and  has been authorized for detection and/or diagnosis of SARS-CoV-2 by  FDA under an Emergency Use Authorization (EUA). This EUA will remain  in effect (meaning this test can be used) for the duration of the  Covid-19 declaration under Section 564(b)(1) of the Act, 21  U.S.C. section 360bbb-3(b)(1), unless the authorization is  terminated or revoked. Performed at [facility], [address] [place], [place] [address]          Radiology Studies: DG Ankle 2 Views Right  Result Date: 11/04/2019 CLINICAL DATA:  Pain EXAM: RIGHT ANKLE - 2 VIEW COMPARISON:  None. FINDINGS: There is soft tissue swelling about the ankle. There is no acute displaced fracture or dislocation. Mild degenerative changes are noted of the talonavicular joint. IMPRESSION: Soft tissue swelling about the ankle without  evidence for acute displaced fracture or dislocation. Electronically Signed   By: Constance Holster M.D.   On: 11/04/2019 22:42   DG Chest Port 1 View  Result Date: 11/04/2019 CLINICAL DATA:  Recent pneumonia EXAM: PORTABLE CHEST 1 VIEW COMPARISON:  11/26/2018 FINDINGS: Prominent interstitial lung markings are again noted. There is no pneumothorax or large pleural effusion. No focal infiltrate. The heart size is stable from prior study. There is no acute osseous abnormality. IMPRESSION: Unchanged bilateral bronchitic changes. Electronically Signed   By: Constance Holster M.D.   On: 11/04/2019 00:53        Scheduled Meds: . acetaminophen  1,000 mg Oral Q6H  . enoxaparin (LOVENOX) injection  40 mg Subcutaneous Q12H  . insulin aspart  0-15 Units Subcutaneous TID WC  . insulin aspart  0-5 Units Subcutaneous QHS  . insulin aspart  4 Units Subcutaneous TID WC  . insulin glargine  20 Units Subcutaneous BID  . insulin starter kit- pen needles  1 kit Other Once  . living well with diabetes book   Does not apply Once  . pantoprazole  40 mg Oral BID   Continuous Infusions: . dextrose 5% lactated ringers Stopped (11/05/19 1204)  . insulin 29 Units/hr (11/05/19 1125)  . lactated ringers 125 mL/hr at 11/05/19 1204     LOS: 1 day        Hosie Poisson, MD [facility]   To contact the attending provider between 7A-7P or the covering provider during after hours 7P-7A, please log into the web site [email or web address] and access using universal [place] password for that web site. If you do not have the password, please call the hospital operator.  11/05/2019, 12:56 PM

## 2019-11-05 NOTE — ED Notes (Signed)
Provided diet ginger ale to pt per pt request.Pt asking for PRN Robaxin for generalized fibromyalgia pain. Will check with primary RN. No further needs expressed at this time.

## 2019-11-05 NOTE — ED Notes (Signed)
This RN still having to spot check pt's vitals. Pt continues to pull off monitoring cords after placed.

## 2019-11-05 NOTE — Progress Notes (Signed)
Cross Cover Note Imaging of right ankle obtained due to patient report of feeling pain after stepping awkward on foot on way to toilet. Notable swelling of extremity even above ankle is reported by patient to be unchanged.  Image suggests soft tissue swelling and not acute dislocation or fracture. Recommend RICE for treatment of the pain

## 2019-11-06 DIAGNOSIS — I10 Essential (primary) hypertension: Secondary | ICD-10-CM | POA: Diagnosis not present

## 2019-11-06 DIAGNOSIS — E871 Hypo-osmolality and hyponatremia: Secondary | ICD-10-CM | POA: Diagnosis not present

## 2019-11-06 DIAGNOSIS — E1110 Type 2 diabetes mellitus with ketoacidosis without coma: Secondary | ICD-10-CM | POA: Diagnosis not present

## 2019-11-06 LAB — GLUCOSE, CAPILLARY
Glucose-Capillary: 337 mg/dL — ABNORMAL HIGH (ref 70–99)
Glucose-Capillary: 354 mg/dL — ABNORMAL HIGH (ref 70–99)
Glucose-Capillary: 277 mg/dL — ABNORMAL HIGH (ref 70–99)
Glucose-Capillary: 301 mg/dL — ABNORMAL HIGH (ref 70–99)

## 2019-11-06 LAB — BASIC METABOLIC PANEL
Anion gap: 14 (ref 5–15)
BUN: 8 mg/dL (ref 6–20)
CO2: 23 mmol/L (ref 22–32)
Calcium: 8.7 mg/dL — ABNORMAL LOW (ref 8.9–10.3)
Chloride: 94 mmol/L — ABNORMAL LOW (ref 98–111)
Creatinine, Ser: 0.76 mg/dL (ref 0.44–1.00)
GFR, Estimated: >60 mL/min (ref >60)
Glucose, Bld: 337 mg/dL — ABNORMAL HIGH (ref 70–99)
Potassium: 3.6 mmol/L (ref 3.5–5.1)
Sodium: 131 mmol/L — ABNORMAL LOW (ref 135–145)

## 2019-11-06 LAB — URINE CULTURE

## 2019-11-06 MED ORDER — INSULIN ASPART 100 UNIT/ML ~~LOC~~ SOLN
6.0000 [IU] | Freq: Three times a day (TID) | SUBCUTANEOUS | Status: DC
  Administered 2019-11-06 – 2019-11-07 (×3): 6 [IU] via SUBCUTANEOUS
  Filled 2019-11-06 (×3): qty 1

## 2019-11-06 MED ORDER — DICYCLOMINE HCL 10 MG PO CAPS
10.0000 mg | ORAL_CAPSULE | Freq: Three times a day (TID) | ORAL | Status: DC
  Administered 2019-11-06 – 2019-11-10 (×14): 10 mg via ORAL
  Filled 2019-11-06 (×16): qty 1

## 2019-11-06 MED ORDER — NYSTATIN 100000 UNIT/GM EX POWD
Freq: Three times a day (TID) | CUTANEOUS | Status: DC
  Filled 2019-11-06: qty 15

## 2019-11-06 MED ORDER — LOPERAMIDE HCL 2 MG PO CAPS
2.0000 mg | ORAL_CAPSULE | ORAL | Status: DC | PRN
  Administered 2019-11-06 – 2019-11-10 (×4): 2 mg via ORAL
  Filled 2019-11-06 (×4): qty 1

## 2019-11-06 MED ORDER — INSULIN GLARGINE 100 UNIT/ML ~~LOC~~ SOLN
30.0000 [IU] | Freq: Two times a day (BID) | SUBCUTANEOUS | Status: DC
  Administered 2019-11-06: 30 [IU] via SUBCUTANEOUS
  Filled 2019-11-06 (×3): qty 0.3

## 2019-11-06 MED ORDER — IPRATROPIUM-ALBUTEROL 0.5-2.5 (3) MG/3ML IN SOLN
3.0000 mL | Freq: Four times a day (QID) | RESPIRATORY_TRACT | Status: DC | PRN
  Administered 2019-11-06: 3 mL via RESPIRATORY_TRACT
  Filled 2019-11-06: qty 3

## 2019-11-06 MED ORDER — ALUM & MAG HYDROXIDE-SIMETH 200-200-20 MG/5ML PO SUSP
30.0000 mL | ORAL | Status: DC | PRN
  Administered 2019-11-06: 30 mL via ORAL
  Filled 2019-11-06: qty 30

## 2019-11-06 NOTE — Discharge Instructions (Signed)
Insulin Pen Video for Home Review:  [email or web address]  Fingerstick glucose (sugar) goals for home: Before meals: 80-130 mg/dl 2-Hours after meals: less than 180 mg/dl Hemoglobin A1c goal: 7% or less  Carbohydrate Counting For People With Diabetes  Foods with carbohydrates make your blood glucose level go up. Learning how to count carbohydrates can help you control your blood glucose levels. First, identify the foods you eat that contain carbohydrates. Then, using the Foods with Carbohydrates chart, determine about how much carbohydrates are in your meals and snacks. Make sure you are eating foods with fiber, protein, and healthy fat along with your carbohydrate foods. Foods with Carbohydrates The following table shows carbohydrate foods that have about 15 grams of carbohydrate each. Using measuring cups, spoons, or a food scale when you first begin learning about carbohydrate counting can help you learn about the portion sizes you typically eat. The following foods have 15 grams carbohydrate each:  Grains  1 slice bread (1 ounce)   1 small tortilla (6-inch size)    large bagel (1 ounce)   1/3 cup pasta or rice (cooked)    hamburger or hot dog bun ( ounce)    cup cooked cereal    to  cup ready-to-eat cereal   2 taco shells (5-inch size) Fruit  1 small fresh fruit ( to 1 cup)    medium banana   17 small grapes (3 ounces)   1 cup melon or berries    cup canned or frozen fruit   2 tablespoons dried fruit (blueberries, cherries, cranberries, raisins)    cup unsweetened fruit juice  Starchy Vegetables   cup cooked beans, peas, corn, potatoes/sweet potatoes    large baked potato (3 ounces)   1 cup acorn or butternut squash  Snack Foods  3 to 6 crackers   8 potato chips or 13 tortilla chips ( ounce to 1 ounce)   3 cups popped popcorn  Dairy  3/4 cup (6 ounces) nonfat plain yogurt, or yogurt with sugar-free sweetener   1 cup  milk   1 cup plain rice, soy, coconut or flavored almond milk Sweets and Desserts   cup ice cream or frozen yogurt   1 tablespoon jam, jelly, pancake syrup, table sugar, or honey   2 tablespoons light pancake syrup   1 inch square of frosted cake or 2 inch square of unfrosted cake   2 small cookies (2/3 ounce each) or  large cookie  Sometimes youll have to estimate carbohydrate amounts if you dont know the exact recipe. One cup of mixed foods like soups can have 1 to 2 carbohydrate servings, while some casseroles might have 2 or more servings of carbohydrate. Foods that have less than 20 calories in each serving can be counted as free foods. Count 1 cup raw vegetables, or  cup cooked non-starchy vegetables as free foods. If you eat 3 or more servings at one meal, then count them as 1 carbohydrate serving.  Foods without Carbohydrates  Not all foods contain carbohydrates. Meat, some dairy, fats, non-starchy vegetables, and many beverages dont contain carbohydrate. So when you count carbohydrates, you can generally exclude chicken, pork, beef, fish, seafood, eggs, tofu, cheese, butter, sour cream, avocado, nuts, seeds, olives, mayonnaise, water, black coffee, unsweetened tea, and zero-calorie drinks. Vegetables with no or low carbohydrate include green beans, cauliflower, tomatoes, and onions. How much carbohydrate should I eat at each meal?  Carbohydrate counting can help you plan your meals and manage your weight. Following are some  starting points for carbohydrate intake at each meal. Work with your registered dietitian nutritionist to find the best range that works for your blood glucose and weight.   To Lose Weight To Maintain Weight  Women 2 - 3 carb servings 3 - 4 carb servings  Men 3 - 4 carb servings 4 - 5 carb servings  Checking your blood glucose after meals will help you know if you need to adjust the timing, type, or number of carbohydrate servings in your meal plan.  Achieve and keep a healthy body weight by balancing your food intake and physical activity.  Tips How should I plan my meals?  Plan for half the food on your plate to include non-starchy vegetables, like salad greens, broccoli, or carrots. Try to eat 3 to 5 servings of non-starchy vegetables every day. Have a protein food at each meal. Protein foods include chicken, fish, meat, eggs, or beans (note that beans contain carbohydrate). These two food groups (non-starchy vegetables and proteins) are low in carbohydrate. If you fill up your plate with these foods, you will eat less carbohydrate but still fill up your stomach. Try to limit your carbohydrate portion to  of the plate.  What fats are healthiest to eat?  Diabetes increases risk for heart disease. To help protect your heart, eat more healthy fats, such as olive oil, nuts, and avocado. Eat less saturated fats like butter, cream, and high-fat meats, like bacon and sausage. Avoid trans fats, which are in all foods that list partially hydrogenated oil as an ingredient. What should I drink?  Choose drinks that are not sweetened with sugar. The healthiest choices are water, carbonated or seltzer waters, and tea and coffee without added sugars.  Sweet drinks will make your blood glucose go up very quickly. One serving of soda or energy drink is  cup. It is best to drink these beverages only if your blood glucose is low.  Artificially sweetened, or diet drinks, typically do not increase your blood glucose if they have zero calories in them. Read labels of beverages, as some diet drinks do have carbohydrate and will raise your blood glucose. Label Reading Tips Read Nutrition Facts labels to find out how many grams of carbohydrate are in a food you want to eat. Dont forget: sometimes serving sizes on the label arent the same as how much food you are going to eat, so you may need to calculate how much carbohydrate is in the food you are serving yourself.    Carbohydrate Counting for People with Diabetes Sample 1-Day Menu  Breakfast  cup yogurt, low fat, low sugar (1 carbohydrate serving)   cup cereal, ready-to-eat, unsweetened (1 carbohydrate serving)  1 cup strawberries (1 carbohydrate serving)   cup almonds ( carbohydrate serving)  Lunch 1, 5 ounce can chunk light tuna  2 ounces cheese, low fat cheddar  6 whole wheat crackers (1 carbohydrate serving)  1 small apple (1 carbohydrate servings)   cup carrots ( carbohydrate serving)   cup snap peas  1 cup 1% milk (1 carbohydrate serving)   Evening Meal Stir fry made with: 3 ounces chicken  1 cup brown rice (3 carbohydrate servings)   cup broccoli ( carbohydrate serving)   cup green beans   cup onions  1 tablespoon olive oil  2 tablespoons teriyaki sauce ( carbohydrate serving)  Evening Snack 1 extra small banana (1 carbohydrate serving)  1 tablespoon peanut butter   Carbohydrate Counting for People with Diabetes Vegan Sample 1-Day  Menu  Breakfast 1 cup cooked oatmeal (2 carbohydrate servings)   cup blueberries (1 carbohydrate serving)  2 tablespoons flaxseeds  1 cup soymilk fortified with calcium and vitamin D  1 cup coffee  Lunch 2 slices whole wheat bread (2 carbohydrate servings)   cup baked tofu   cup lettuce  2 slices tomato  2 slices avocado   cup baby carrots ( carbohydrate serving)  1 orange (1 carbohydrate serving)  1 cup soymilk fortified with calcium and vitamin D   Evening Meal Burrito made with: 1 6-inch corn tortilla (1 carbohydrate serving)  1 cup refried vegetarian beans (2 carbohydrate servings)   cup chopped tomatoes   cup lettuce   cup salsa  1/3 cup brown rice (1 carbohydrate serving)  1 tablespoon olive oil for rice   cup zucchini   Evening Snack 6 small whole grain crackers (1 carbohydrate serving)  2 apricots ( carbohydrate serving)   cup unsalted peanuts ( carbohydrate serving)    Carbohydrate Counting for People with  Diabetes Vegetarian (Lacto-Ovo) Sample 1-Day Menu  Breakfast 1 cup cooked oatmeal (2 carbohydrate servings)   cup blueberries (1 carbohydrate serving)  2 tablespoons flaxseeds  1 egg  1 cup 1% milk (1 carbohydrate serving)  1 cup coffee  Lunch 2 slices whole wheat bread (2 carbohydrate servings)  2 ounces low-fat cheese   cup lettuce  2 slices tomato  2 slices avocado   cup baby carrots ( carbohydrate serving)  1 orange (1 carbohydrate serving)  1 cup unsweetened tea  Evening Meal Burrito made with: 1 6-inch corn tortilla (1 carbohydrate serving)   cup refried vegetarian beans (1 carbohydrate serving)   cup tomatoes   cup lettuce   cup salsa  1/3 cup brown rice (1 carbohydrate serving)  1 tablespoon olive oil for rice   cup zucchini  1 cup 1% milk (1 carbohydrate serving)  Evening Snack 6 small whole grain crackers (1 carbohydrate serving)  2 apricots ( carbohydrate serving)   cup unsalted peanuts ( carbohydrate serving)    Copyright 2020  Academy of Nutrition and Dietetics. All rights reserved.  Using Nutrition Labels: Carbohydrate   Serving Size   Look at the serving size. All the information on the label is based on this portion.  Servings Per Container   The number of servings contained in the package.  Guidelines for Carbohydrate   Look at the total grams of carbohydrate in the serving size.   1 carbohydrate choice = 15 grams of carbohydrate. Range of Carbohydrate Grams Per Choice  Carbohydrate Grams/Choice Carbohydrate Choices  6-10   11-20 1  21-25 1  26-35 2  36-40 2  41-50 3  51-55 3  56-65 4  66-70 4  71-80 5    Copyright 2020  Academy of Nutrition and Dietetics. All rights reserved.

## 2019-11-06 NOTE — Progress Notes (Signed)
Pt has declined to go down for MRI for the 2nd night now. She would like to have it during the day due to anxiety and acid reflux issues. I have informed her that MRI time slots are not guaranteed and we will have to inform her attending MD in the AM. MRI tech also informed of this.

## 2019-11-06 NOTE — Progress Notes (Addendum)
  RD consulted for nutrition education regarding diabetes.   Lab Results  Component Value Date   HGBA1C 11.4 (H) 11/04/2019    RD provided "Carbohydrate Counting for People with Diabetes" handout from the Academy of Nutrition and Dietetics in discharge instructions. Attempted to reach patient x2 to discuss diet modifications but no answer. RD placed order for outpatient DM education. Will attempt contact patient at later date if possible.   Labs and medications reviewed. No further nutrition interventions warranted at this time. RD contact information provided. If additional nutrition issues arise, please re-consult RD.  Paula Gilmore RD, LDN Clinical Nutrition Pager listed in [place]

## 2019-11-06 NOTE — Progress Notes (Signed)
PROGRESS NOTE    Paula Gilmore  KPV:[id number] DOB: 16-Dec-1970 DOA: 11/04/2019 PCP: Lawernce Pitts, FNP    Chief Complaint  Patient presents with  . Hyperglycemia    Brief Narrative:  49 year old lady with prior hypertension, MORBID OBESITY, chronic pain, presents to ED with blurred vision, frequent urination, increased thirst, and on for a few weeks was found to be in DKA on arrival to ED.  She was admitted for evaluation and management of DKA and new onset DM.  Pt seen and examined at bedside, CBG's improving,.. .   Assessment & Plan:   Principal Problem:   DKA (diabetic ketoacidosis) ([place]) Active Problems:   Sleep apnea   Morbid obesity with BMI of 60.0-69.9, adult (HCC)   Chronic pain   HTN (hypertension)   UTI (urinary tract infection)   New onset type 2 diabetes mellitus ([place])    DKA: Improved after insulin gtt.   Increase the lantus 30 BID, increase Novolog 6 units TIDAC AND SSI.  CBG (last 3)  Recent Labs    11/05/19 2104 11/06/19 0819 11/06/19 1142  GLUCAP 241* 337* 354*   Anion gap is closed bicarb within normal limits. Diabetes education/coordinator consulted and recommendations given.    Essential hypertension Well controlled at this time.     Frequent urination and abnormal UA, will treat for urinary tract infection with IV Rocephin and follow urine cultures.    Morbid obesity Recommend referral to bariatric surgery for weight loss.. Body mass index is 64.56 kg/m. Dietary consulted.    Blurry vision and dizziness for a few weeks now No falls or loss of consciousness MRI of the brain without contrast ordered for further evaluation. Pt reports headache.     Right ankle sprain X-rays of the ankle negative for fracture show some soft tissue swelling. Pain control .     Hypokalemia Replaced.    Hyponatremia probably from hyperglycemia. Persistent.     Leukocytosis probably from UTI/reactive. Repeat CBC in am.      Itching in the groin area:  Candidiasis of the groin area.  Started her on diflucan.  Nystatin powder.    GERD;  Stable, started her on protonix.     DVT prophylaxis: (Lovenox) Code Status: (Full code.  Family Communication: none at bedside.  Disposition:   Status is: Inpatient  Remains inpatient appropriate because:Ongoing diagnostic testing needed not appropriate for outpatient work up, IV treatments appropriate due to intensity of illness or inability to take PO and Inpatient level of care appropriate due to severity of illness   Dispo: The patient is from: Home              Anticipated d/c is to: Home              Anticipated d/c date is: 2 days              Patient currently is not medically stable to d/c.       Consultants:   None.    Procedures: MRI brain without contrast.   Antimicrobials: none.   Subjective:  Objective: Vitals:   11/05/19 2103 11/06/19 0051 11/06/19 0500 11/06/19 1216  BP: 123/66 (!) 150/68 132/65 134/78  Pulse: 91 97 92 71  Resp: 19  15   Temp: 99 F (37.2 C) 97.8 F (36.6 C) 98 F (36.7 C) 98.1 F (36.7 C)  TempSrc: Oral Oral Oral Oral  SpO2: 95% 94% 93% 93%  Weight:      Height:  Intake/Output Summary (Last 24 hours) at 11/06/2019 1306 Last data filed at 11/06/2019 0900 Gross per 24 hour  Intake 3590.82 ml  Output --  Net 3590.82 ml   Filed Weights   11/03/19 1955  Weight: (!) 181.4 kg    Examination:  General exam: alert and comfortable, not in distress.  Respiratory system: clear to auscultation, no wheezing or rhonchi.  Cardiovascular system: S1S2, RRR, no JVD,  Gastrointestinal system: abdomen is soft, non tender , bowel sounds wnl.  Central nervous system: alert and oriented. Non focal.  Extremities: pedal edema  Skin: No rashes.  Psychiatry:  Mood is appropriate.     Data Reviewed: I have personally reviewed following labs and imaging studies  CBC: Recent Labs  Lab 11/03/19 2003  11/04/19 0136  WBC 11.9* 12.1*  HGB 14.4 13.8  HCT 41.0 39.4  MCV 85.8 86.0  PLT 298 226    Basic Metabolic Panel: Recent Labs  Lab 11/04/19 2218 11/05/19 0732 11/05/19 1410 11/05/19 2100 11/06/19 0407  NA 132* 133* UNABLE TO REPORT DUE TO LIPEMIC INTERFERENCE 133* 131*  K 3.5 3.4* 3.8 3.8 3.6  CL 93* 93* 96* 96* 94*  CO2 23 26 18* 23 23  GLUCOSE 187* 215* 265* 243* 337*  BUN '14 11 11 8 8  ' CREATININE 0.78 0.75 0.74 0.77 0.76  CALCIUM 9.4 9.1 8.8* 8.9 8.7*  MG 2.0  --   --   --   --     GFR: Estimated Creatinine Clearance: 145.2 mL/min (by C-G formula based on SCr of 0.76 mg/dL).  Liver Function Tests: No results for input(s): AST, ALT, ALKPHOS, BILITOT, PROT, ALBUMIN in the last 168 hours.  CBG: Recent Labs  Lab 11/05/19 1655 11/05/19 1939 11/05/19 2104 11/06/19 0819 11/06/19 1142  GLUCAP 218* 227* 241* 337* 354*     Recent Results (from the past 240 hour(s))  Urine culture     Status: Abnormal   Collection Time: 11/03/19  8:03 PM   Specimen: Urine, Random  Result Value Ref Range Status   Specimen Description   Final    URINE, RANDOM Performed at [facility], [address], [place], [place] [address]    Special Requests   Final    NONE Performed at [facility], [address] [place], [place] [address]    Culture MULTIPLE SPECIES PRESENT, SUGGEST RECOLLECTION (A)  Final   Report Status 11/06/2019 FINAL  Final  Respiratory Panel by RT PCR (Flu A&B, Covid) - Nasopharyngeal Swab     Status: None   Collection Time: 11/04/19 12:44 AM   Specimen: Nasopharyngeal Swab  Result Value Ref Range Status   SARS Coronavirus 2 by RT PCR NEGATIVE NEGATIVE Final    Comment: (NOTE) SARS-CoV-2 target nucleic acids are NOT DETECTED.  The SARS-CoV-2 RNA is generally detectable in upper respiratoy specimens during the acute phase of infection. The lowest concentration of SARS-CoV-2 viral copies this assay can detect is 131 copies/mL. A negative  result does not preclude SARS-Cov-2 infection and should not be used as the sole basis for treatment or other patient management decisions. A negative result may occur with  improper specimen collection/handling, submission of specimen other than nasopharyngeal swab, presence of viral mutation(s) within the areas targeted by this assay, and inadequate number of viral copies (<131 copies/mL). A negative result must be combined with clinical observations, patient history, and epidemiological information. The expected result is Negative.  Fact Sheet for Patients:  PinkCheek.be  Fact Sheet for Healthcare Providers:  GravelBags.it  This test is no t yet approved or cleared by the [place] and  has been authorized for detection and/or diagnosis of SARS-CoV-2 by FDA under an Emergency Use Authorization (EUA). This EUA will remain  in effect (meaning this test can be used) for the duration of the COVID-19 declaration under Section 564(b)(1) of the Act, 21 U.S.C. section 360bbb-3(b)(1), unless the authorization is terminated or revoked sooner.     Influenza A by PCR NEGATIVE NEGATIVE Final   Influenza B by PCR NEGATIVE NEGATIVE Final    Comment: (NOTE) The Xpert Xpress SARS-CoV-2/FLU/RSV assay is intended as an aid in  the diagnosis of influenza from Nasopharyngeal swab specimens and  should not be used as a sole basis for treatment. Nasal washings and  aspirates are unacceptable for Xpert Xpress SARS-CoV-2/FLU/RSV  testing.  Fact Sheet for Patients: PinkCheek.be  Fact Sheet for Healthcare Providers: GravelBags.it  This test is not yet approved or cleared by the [place] FDA and  has been authorized for detection and/or diagnosis of SARS-CoV-2 by  FDA under an Emergency Use Authorization (EUA). This EUA will remain  in effect (meaning this test can be used)  for the duration of the  Covid-19 declaration under Section 564(b)(1) of the Act, 21  U.S.C. section 360bbb-3(b)(1), unless the authorization is  terminated or revoked. Performed at [facility], [address] [place], [place] [address]          Radiology Studies: DG Ankle 2 Views Right  Result Date: 11/04/2019 CLINICAL DATA:  Pain EXAM: RIGHT ANKLE - 2 VIEW COMPARISON:  None. FINDINGS: There is soft tissue swelling about the ankle. There is no acute displaced fracture or dislocation. Mild degenerative changes are noted of the talonavicular joint. IMPRESSION: Soft tissue swelling about the ankle without evidence for acute displaced fracture or dislocation. Electronically Signed   By: Constance Holster M.D.   On: 11/04/2019 22:42        Scheduled Meds: . acetaminophen  1,000 mg Oral Q6H  . dicyclomine  10 mg Oral TID AC  . enoxaparin (LOVENOX) injection  40 mg Subcutaneous Q12H  . fluconazole  100 mg Oral Daily  . insulin aspart  0-15 Units Subcutaneous TID WC  . insulin aspart  0-5 Units Subcutaneous QHS  . insulin aspart  6 Units Subcutaneous TID WC  . insulin glargine  30 Units Subcutaneous BID  . insulin starter kit- pen needles  1 kit Other Once  . living well with diabetes book   Does not apply Once  . nystatin   Topical TID  . pantoprazole  40 mg Oral BID   Continuous Infusions: . sodium chloride 75 mL/hr at 11/05/19 2132  . cefTRIAXone (ROCEPHIN)  IV 1 g (11/05/19 2133)     LOS: 2 days        Hosie Poisson, MD [facility]   To contact the attending provider between 7A-7P or the covering provider during after hours 7P-7A, please log into the web site [email or web address] and access using universal [place] password for that web site. If you do not have the password, please call the hospital operator.  11/06/2019, 1:06 PM

## 2019-11-06 NOTE — Progress Notes (Signed)
Admit with: DKA/ New onset type 2 diabetes mellitus  Presents with blurred vision, dry mouth, frequent urination and increased thirst that started after completing a Z-Pak and steroids for pneumonia  Results for HAREEM, SUROWIEC (MRN [id number]) as of 11/06/2019 09:48  Ref. Range 11/05/2019 13:06 11/05/2019 16:55 11/05/2019 19:39 11/05/2019 21:04 11/06/2019 08:19  Glucose-Capillary Latest Ref Range: 70 - 99 mg/dL 249 (H) 218 (H) 227 (H) 241 (H) 337 (H)   Current Insulin Orders:  Lantuts 20 units bid Novolog 0-15 units tid + hs Novolog 4 units tid meal coverage  PCP: Lawernce Pitts, FNP--Has Medicaid coverage  Hemoglobin A1c 11.4% Glucose 300's this am  1. Increase Lantus 30 units BID  2. Increase Novolog 6 units TID with meals for meal coverage  --Will follow patient during hospitalization--  Tama Headings RN, MSN, BC-ADM Inpatient Diabetes Coordinator Team Pager [phone] (8a-5p)

## 2019-11-06 NOTE — Progress Notes (Signed)
CPAP declined.

## 2019-11-07 ENCOUNTER — Inpatient Hospital Stay: Payer: Medicaid Other

## 2019-11-07 DIAGNOSIS — I10 Essential (primary) hypertension: Secondary | ICD-10-CM | POA: Diagnosis not present

## 2019-11-07 DIAGNOSIS — E871 Hypo-osmolality and hyponatremia: Secondary | ICD-10-CM | POA: Diagnosis not present

## 2019-11-07 DIAGNOSIS — E1110 Type 2 diabetes mellitus with ketoacidosis without coma: Secondary | ICD-10-CM | POA: Diagnosis not present

## 2019-11-07 LAB — COMPREHENSIVE METABOLIC PANEL
ALT: 32 U/L (ref 0–44)
AST: 42 U/L — ABNORMAL HIGH (ref 15–41)
Albumin: 3.3 g/dL — ABNORMAL LOW (ref 3.5–5.0)
Alkaline Phosphatase: 67 U/L (ref 38–126)
Anion gap: 9 (ref 5–15)
BUN: 7 mg/dL (ref 6–20)
CO2: 23 mmol/L (ref 22–32)
Calcium: 8.5 mg/dL — ABNORMAL LOW (ref 8.9–10.3)
Chloride: 99 mmol/L (ref 98–111)
Creatinine, Ser: 0.63 mg/dL (ref 0.44–1.00)
GFR, Estimated: >60 mL/min (ref >60)
Glucose, Bld: 332 mg/dL — ABNORMAL HIGH (ref 70–99)
Potassium: 3.9 mmol/L (ref 3.5–5.1)
Sodium: 131 mmol/L — ABNORMAL LOW (ref 135–145)
Total Bilirubin: 0.9 mg/dL (ref 0.3–1.2)
Total Protein: 6.4 g/dL — ABNORMAL LOW (ref 6.5–8.1)

## 2019-11-07 LAB — SEDIMENTATION RATE: Sed Rate: 63 mm/hr — ABNORMAL HIGH (ref 0–20)

## 2019-11-07 LAB — CBC
HCT: 34.7 % — ABNORMAL LOW (ref 36.0–46.0)
Hemoglobin: 11.5 g/dL — ABNORMAL LOW (ref 12.0–15.0)
MCH: 29.6 pg (ref 26.0–34.0)
MCHC: 33.1 g/dL (ref 30.0–36.0)
MCV: 89.4 fL (ref 80.0–100.0)
Platelets: 221 10*3/uL (ref 150–400)
RBC: 3.88 MIL/uL (ref 3.87–5.11)
RDW: 13.9 % (ref 11.5–15.5)
WBC: 9.2 10*3/uL (ref 4.0–10.5)
nRBC: 0 % (ref 0.0–0.2)

## 2019-11-07 LAB — GLUCOSE, CAPILLARY
Glucose-Capillary: 319 mg/dL — ABNORMAL HIGH (ref 70–99)
Glucose-Capillary: 355 mg/dL — ABNORMAL HIGH (ref 70–99)
Glucose-Capillary: 235 mg/dL — ABNORMAL HIGH (ref 70–99)
Glucose-Capillary: 241 mg/dL — ABNORMAL HIGH (ref 70–99)

## 2019-11-07 LAB — BRAIN NATRIURETIC PEPTIDE: B Natriuretic Peptide: 43.9 pg/mL (ref 0.0–100.0)

## 2019-11-07 MED ORDER — LORATADINE 10 MG PO TABS
10.0000 mg | ORAL_TABLET | Freq: Every day | ORAL | Status: DC
  Administered 2019-11-07 – 2019-11-10 (×4): 10 mg via ORAL
  Filled 2019-11-07 (×4): qty 1

## 2019-11-07 MED ORDER — ACETAMINOPHEN 325 MG PO TABS
650.0000 mg | ORAL_TABLET | ORAL | Status: DC | PRN
  Administered 2019-11-07 – 2019-11-10 (×7): 650 mg via ORAL
  Filled 2019-11-07 (×7): qty 2

## 2019-11-07 MED ORDER — CEPHALEXIN 500 MG PO CAPS
500.0000 mg | ORAL_CAPSULE | Freq: Four times a day (QID) | ORAL | Status: DC
  Administered 2019-11-07 – 2019-11-09 (×7): 500 mg via ORAL
  Filled 2019-11-07 (×7): qty 1

## 2019-11-07 MED ORDER — FLUTICASONE PROPIONATE 50 MCG/ACT NA SUSP
2.0000 | Freq: Every day | NASAL | Status: DC
  Administered 2019-11-07 – 2019-11-10 (×4): 2 via NASAL
  Filled 2019-11-07: qty 16

## 2019-11-07 MED ORDER — INSULIN GLARGINE 100 UNIT/ML ~~LOC~~ SOLN
40.0000 [IU] | Freq: Two times a day (BID) | SUBCUTANEOUS | Status: DC
  Administered 2019-11-07 – 2019-11-09 (×5): 40 [IU] via SUBCUTANEOUS
  Filled 2019-11-07 (×6): qty 0.4

## 2019-11-07 MED ORDER — FUROSEMIDE 10 MG/ML IJ SOLN
20.0000 mg | Freq: Every day | INTRAMUSCULAR | Status: DC
  Administered 2019-11-07 – 2019-11-09 (×3): 20 mg via INTRAVENOUS
  Filled 2019-11-07 (×4): qty 4

## 2019-11-07 MED ORDER — ENOXAPARIN SODIUM 100 MG/ML ~~LOC~~ SOLN: 0.5000 mg/kg | SUBCUTANEOUS | Status: DC

## 2019-11-07 MED ORDER — INSULIN ASPART 100 UNIT/ML ~~LOC~~ SOLN
10.0000 [IU] | Freq: Three times a day (TID) | SUBCUTANEOUS | Status: DC
  Administered 2019-11-07 – 2019-11-09 (×6): 10 [IU] via SUBCUTANEOUS
  Filled 2019-11-07 (×6): qty 1

## 2019-11-07 MED ORDER — ASPIRIN EC 81 MG PO TBEC: 81.0000 mg | DELAYED_RELEASE_TABLET | Freq: Every day | ORAL | Status: DC

## 2019-11-07 NOTE — Progress Notes (Signed)
PROGRESS NOTE    Paula Gilmore  TOI:[id number] DOB: 1970-10-17 DOA: 11/04/2019 PCP: Lawernce Pitts, FNP    Chief Complaint  Patient presents with  . Hyperglycemia    Brief Narrative:  49 year old lady with prior hypertension, MORBID OBESITY, chronic pain, presents to ED with blurred vision, frequent urination, increased thirst, and on for a few weeks was found to be in DKA on arrival to ED.  She was admitted for evaluation and management of DKA and new onset DM.  Pt seen and examined at bedside, CBG's improving,.. but still in 300's range . Started on Lantus 15 BID and  Increased lantus to 40 units BID today. Pt reports some sob and headache earlier today.    Assessment & Plan:   Principal Problem:   DKA (diabetic ketoacidosis) ([name]) Active Problems:   Sleep apnea   Morbid obesity with BMI of 60.0-69.9, adult (HCC)   Chronic pain   HTN (hypertension)   UTI (urinary tract infection)   New onset type 2 diabetes mellitus ([name])    DKA: Improved after insulin gtt.   Increase the lantus to 40 units BID, increase the Novolog to 10 units [facility] and will change to resistant SSI.  CBG (last 3)  Recent Labs    11/07/19 0739 11/07/19 1134 11/07/19 1631  GLUCAP 319* 355* 235*   Anion gap is closed bicarb within normal limits. Diabetes education/coordinator consulted and recommendations given.    Essential hypertension Well controlled at this time.     Frequent urination and abnormal UA, will treat for urinary tract infection  Urine cultures show multiple species.  Change to oral keflex to complete the course.     Morbid obesity Recommend referral to bariatric surgery for weight loss.. Body mass index is 64.56 kg/m. Dietary consulted.    Blurry vision and dizziness for a few weeks now/ persistent daily headaches/  No falls or loss of consciousness MRI of the brain without contrast ordered for further evaluation. Its shows empty sella, possible  intracranial hypertension.  Neurology consulted to see if we can do LP and check opening pressure.  Hold lasix and aspirin for the LP in am.    Right ankle sprain X-rays of the ankle negative for fracture show some soft tissue swelling. Pain control .     Hypokalemia Replaced.    Hyponatremia probably from hyperglycemia. Persistent.    COPD:  Some dyspnea, pt on lasix at home, will get BNP and echocardiogram to rule out diastolic CHF.  Resume duonebs as needed.    Leukocytosis probably from UTI/reactive. Resolved with antibiotics.    Itching in the groin area:  Candidiasis of the groin area.  Started her on diflucan.  Nystatin powder.    GERD;  Stable, started her on protonix.     DVT prophylaxis: (Lovenox) Code Status: (Full code.  Family Communication: none at bedside.  Disposition:   Status is: Inpatient  Remains inpatient appropriate because:Ongoing diagnostic testing needed not appropriate for outpatient work up, IV treatments appropriate due to intensity of illness or inability to take PO and Inpatient level of care appropriate due to severity of illness   Dispo: The patient is from: Home              Anticipated d/c is to: Home              Anticipated d/c date is: 2 days              Patient currently is not  medically stable to d/c.       Consultants:   Neurology    Procedures: MRI brain without contrast.   Antimicrobials: none.   Subjective: reports persistent headaches.   Objective: Vitals:   11/06/19 2005 11/06/19 2332 11/07/19 0357 11/07/19 1135  BP: 97/65 (!) 113/52 (!) 141/82 140/87  Pulse: 89 85 82 85  Resp: _0 Temp: 98 F (36.7 C) 98.6 F (37 C) 98.4 F (36.9 C) 98.6 F (37 C)  TempSrc:   Oral Oral  SpO2: 100% 95% 94% 97%  Weight:      Height:        Intake/Output Summary (Last 24 hours) at 11/07/2019 1641 Last data filed at 11/07/2019 1001 Gross per 24 hour  Intake 1480 ml  Output --  Net 1480 ml    Filed Weights   11/03/19 1955  Weight: (!) 181.4 kg    Examination:  General exam:  Morbidly obese lady in mild distress from headaches.  Respiratory system: Diminished at bases, no wheezing or rhonchi.  Cardiovascular system: S1S2, RRR, pedal edema present.  Gastrointestinal system: abdomen is soft, non tender bowel sounds wnl.  Central nervous system: alert and oriented, no focal deficits.she reports vision is clearing up.  Extremities: pedal edema present.  Skin: No rashes seen.  Psychiatry:  Mood is appropriate.     Data Reviewed: I have personally reviewed following labs and imaging studies  CBC: Recent Labs  Lab 11/03/19 2003 11/04/19 0136 11/07/19 0917  WBC 11.9* 12.1* 9.2  HGB 14.4 13.8 11.5*  HCT 41.0 39.4 34.7*  MCV 85.8 86.0 89.4  PLT 298 284 675    Basic Metabolic Panel: Recent Labs  Lab 11/04/19 2218 11/04/19 2218 11/05/19 0732 11/05/19 1410 11/05/19 2100 11/06/19 0407 11/07/19 0917  NA 132*   < > 133* UNABLE TO REPORT DUE TO LIPEMIC INTERFERENCE 133* 131* 131*  K 3.5   < > 3.4* 3.8 3.8 3.6 3.9  CL 93*   < > 93* 96* 96* 94* 99  CO2 23   < > 26 18* _1 GLUCOSE 187*   < > 215* 265* 243* 337* 332*  BUN 14   < > _2 CREATININE 0.78   < > 0.75 0.74 0.77 0.76 0.63  CALCIUM 9.4   < > 9.1 8.8* 8.9 8.7* 8.5*  MG 2.0  --   --   --   --   --   --    < > = values in this interval not displayed.    GFR: Estimated Creatinine Clearance: 145.2 mL/min (by C-G formula based on SCr of 0.63 mg/dL).  Liver Function Tests: Recent Labs  Lab 11/07/19 0917  AST 42*  ALT 32  ALKPHOS 67  BILITOT 0.9  PROT 6.4*  ALBUMIN 3.3*    CBG: Recent Labs  Lab 11/06/19 1631 11/06/19 2007 11/07/19 0739 11/07/19 1134 11/07/19 1631  GLUCAP 277* 301* 319* 355* 235*     Recent Results (from the past 240 hour(s))  Urine culture     Status: Abnormal   Collection Time: 11/03/19  8:03 PM   Specimen: Urine, Random  Result Value Ref Range Status    Specimen Description   Final    URINE, RANDOM Performed at [facility], [address], [place], [place] [address]    Special Requests   Final    NONE Performed at [facility], [address] [place], [place] [address]  Culture MULTIPLE SPECIES PRESENT, SUGGEST RECOLLECTION (A)  Final   Report Status 11/06/2019 FINAL  Final  Respiratory Panel by RT PCR (Flu A&B, Covid) - Nasopharyngeal Swab     Status: None   Collection Time: 11/04/19 12:44 AM   Specimen: Nasopharyngeal Swab  Result Value Ref Range Status   SARS Coronavirus 2 by RT PCR NEGATIVE NEGATIVE Final    Comment: (NOTE) SARS-CoV-2 target nucleic acids are NOT DETECTED.  The SARS-CoV-2 RNA is generally detectable in upper respiratoy specimens during the acute phase of infection. The lowest concentration of SARS-CoV-2 viral copies this assay can detect is 131 copies/mL. A negative result does not preclude SARS-Cov-2 infection and should not be used as the sole basis for treatment or other patient management decisions. A negative result may occur with  improper specimen collection/handling, submission of specimen other than nasopharyngeal swab, presence of viral mutation(s) within the areas targeted by this assay, and inadequate number of viral copies (<131 copies/mL). A negative result must be combined with clinical observations, patient history, and epidemiological information. The expected result is Negative.  Fact Sheet for Patients:  PinkCheek.be  Fact Sheet for Healthcare Providers:  GravelBags.it  This test is no t yet approved or cleared by the [place] FDA and  has been authorized for detection and/or diagnosis of SARS-CoV-2 by FDA under an Emergency Use Authorization (EUA). This EUA will remain  in effect (meaning this test can be used) for the duration of the COVID-19 declaration under Section 564(b)(1) of the Act, 21  U.S.C. section 360bbb-3(b)(1), unless the authorization is terminated or revoked sooner.     Influenza A by PCR NEGATIVE NEGATIVE Final   Influenza B by PCR NEGATIVE NEGATIVE Final    Comment: (NOTE) The Xpert Xpress SARS-CoV-2/FLU/RSV assay is intended as an aid in  the diagnosis of influenza from Nasopharyngeal swab specimens and  should not be used as a sole basis for treatment. Nasal washings and  aspirates are unacceptable for Xpert Xpress SARS-CoV-2/FLU/RSV  testing.  Fact Sheet for Patients: PinkCheek.be  Fact Sheet for Healthcare Providers: GravelBags.it  This test is not yet approved or cleared by the [place] FDA and  has been authorized for detection and/or diagnosis of SARS-CoV-2 by  FDA under an Emergency Use Authorization (EUA). This EUA will remain  in effect (meaning this test can be used) for the duration of the  Covid-19 declaration under Section 564(b)(1) of the Act, 21  U.S.C. section 360bbb-3(b)(1), unless the authorization is  terminated or revoked. Performed at [facility], [address], [place], [place] [address]          Radiology Studies: MR BRAIN WO CONTRAST  Result Date: 11/07/2019 CLINICAL DATA:  Dizziness, persistence/recurrence.  Blurry vision. EXAM: MRI HEAD WITHOUT CONTRAST TECHNIQUE: Multiplanar, multiecho pulse sequences of the brain and surrounding structures were obtained without intravenous contrast. COMPARISON:  None. FINDINGS: Brain: No acute infarction, hemorrhage, hydrocephalus, extra-axial collection or mass lesion. Dilated perivascular space in the inferior left basal ganglia. Mild scattered T2/FLAIR hyperintensities within the subcortical and periventricular white matter, most likely secondary to chronic microvascular ischemic disease and not particularly advanced for age. Partially empty sella. Vascular: Major proximal arterial flow voids are maintained at  the skull base. Skull and upper cervical spine: Diffuse T1 hypointensity of the marrow. Sinuses/Orbits: Minimal ethmoid air cell mucosal thickening. Otherwise, the sinuses are clear. No acute orbital abnormality. Other: Small bilateral mastoid effusions. IMPRESSION: 1. No evidence of acute intracranial abnormality. Specifically, no acute infarct.  2. Partially empty sella, which may be an incidental finding but can be seen with idiopathic intracranial hypertension in the correct clinical setting. 3. Diffuse T1 hypointensity of the bone marrow, which is nonspecific but can be seen with chronic hypoxia (such as in smokers), chronic anemia, or lymphoproliferative disorders. Electronically Signed   By: Margaretha Sheffield MD   On: 11/07/2019 08:23        Scheduled Meds: . aspirin EC  81 mg Oral Daily  . cephALEXin  500 mg Oral Q6H  . dicyclomine  10 mg Oral TID AC  . [START ON 11/08/2019] enoxaparin (LOVENOX) injection  0.5 mg/kg Subcutaneous Q24H  . fluconazole  100 mg Oral Daily  . fluticasone  2 spray Each Nare Daily  . furosemide  20 mg Intravenous Daily  . insulin aspart  0-15 Units Subcutaneous TID WC  . insulin aspart  0-5 Units Subcutaneous QHS  . insulin aspart  10 Units Subcutaneous TID WC  . insulin glargine  40 Units Subcutaneous BID  . insulin starter kit- pen needles  1 kit Other Once  . living well with diabetes book   Does not apply Once  . loratadine  10 mg Oral Daily  . nystatin   Topical TID  . pantoprazole  40 mg Oral BID   Continuous Infusions:    LOS: 3 days        Hosie Poisson, MD [facility]   To contact the attending provider between 7A-7P or the covering provider during after hours 7P-7A, please log into the web site [email or web address] and access using universal [place] password for that web site. If you do not have the password, please call the hospital operator.  11/07/2019, 4:41 PM

## 2019-11-07 NOTE — Progress Notes (Signed)
Inpatient Diabetes Program Recommendations  AACE/ADA: New Consensus Statement on Inpatient Glycemic Control (2015)  Target Ranges:  Prepandial:   less than 140 mg/dL      Peak postprandial:   less than 180 mg/dL (1-2 hours)      Critically ill patients:  140 - 180 mg/dL   Lab Results  Component Value Date   GLUCAP 319 (H) 11/07/2019   HGBA1C 11.4 (H) 11/04/2019    Review of Glycemic Control  Diabetes history: New Onset Current orders for Inpatient glycemic control: Current Insulin Orders: Lantuts 30 units bid Novolog 0-15 units tid + hs Novolog 6 units tid meal coverage  Inpatient Diabetes Program Recommendations:    Noted Lantus increased to 40 units bid. -Post prandial CBGs remain elevated -Increase Novolog meal coverage to 10 units tid if eats 50%  Thank you, Bethena Roys E. Deontez Klinke, RN, MSN, CDE  Diabetes Coordinator Inpatient Glycemic Control Team Team Pager [phone] (8am-5pm) 11/07/2019 11:10 AM

## 2019-11-07 NOTE — Progress Notes (Signed)
PHARMACIST - PHYSICIAN COMMUNICATION  CONCERNING:  Enoxaparin (Lovenox) for DVT Prophylaxis    RECOMMENDATION: Patient was prescribed enoxaprin 40mg  q12 hours for VTE prophylaxis.   Filed Weights   11/03/19 1955  Weight: (!) 181.4 kg (400 lb)    Body mass index is 64.56 kg/m.  Estimated Creatinine Clearance: 145.2 mL/min (by C-G formula based on SCr of 0.63 mg/dL).    Based on [place] patient is candidate for enoxaparin 0.5mg /kg TBW SQ every 24 hours based on BMI being >30.   DESCRIPTION: Pharmacy has adjusted enoxaparin dose per [facility] policy.  Patient is now receiving enoxaparin __90__mg every _24__ hours   Rayna Sexton, PharmD, BCPS Clinical Pharmacist 11/07/2019 2:13 PM

## 2019-11-08 ENCOUNTER — Inpatient Hospital Stay: Admit: 2019-11-08 | Payer: Medicaid Other

## 2019-11-08 ENCOUNTER — Inpatient Hospital Stay (HOSPITAL_COMMUNITY)
Admit: 2019-11-08 | Discharge: 2019-11-08 | Disposition: A | Discharge status: Home / Self Care | Payer: Medicaid Other | Attending: Internal Medicine | Admitting: Internal Medicine

## 2019-11-08 ENCOUNTER — Inpatient Hospital Stay: Payer: Medicaid Other

## 2019-11-08 DIAGNOSIS — E871 Hypo-osmolality and hyponatremia: Secondary | ICD-10-CM | POA: Diagnosis not present

## 2019-11-08 DIAGNOSIS — H538 Other visual disturbances: Secondary | ICD-10-CM

## 2019-11-08 DIAGNOSIS — R0600 Dyspnea, unspecified: Secondary | ICD-10-CM | POA: Diagnosis not present

## 2019-11-08 DIAGNOSIS — E1110 Type 2 diabetes mellitus with ketoacidosis without coma: Secondary | ICD-10-CM | POA: Diagnosis not present

## 2019-11-08 DIAGNOSIS — I10 Essential (primary) hypertension: Secondary | ICD-10-CM | POA: Diagnosis not present

## 2019-11-08 LAB — ECHOCARDIOGRAM COMPLETE
AR max vel: 1.6 cm2
AV Area VTI: 1.98 cm2
AV Area mean vel: 1.57 cm2
AV Mean grad: 9.3 mmHg
AV Peak grad: 17.4 mmHg
Ao pk vel: 2.08 m/s
Area-P 1/2: 2.62 cm2
S' Lateral: 2.97 cm

## 2019-11-08 LAB — GLUCOSE, CAPILLARY
Glucose-Capillary: 270 mg/dL — ABNORMAL HIGH (ref 70–99)
Glucose-Capillary: 294 mg/dL — ABNORMAL HIGH (ref 70–99)
Glucose-Capillary: 193 mg/dL — ABNORMAL HIGH (ref 70–99)
Glucose-Capillary: 209 mg/dL — ABNORMAL HIGH (ref 70–99)

## 2019-11-08 LAB — APTT: aPTT: 28 seconds (ref 24–36)

## 2019-11-08 LAB — PROTIME-INR
INR: 0.9 (ref 0.8–1.2)
Prothrombin Time: 11.8 seconds (ref 11.4–15.2)

## 2019-11-08 MED ORDER — INSULIN ASPART 100 UNIT/ML ~~LOC~~ SOLN: 10.0000 [IU] | Freq: Three times a day (TID) | SUBCUTANEOUS | 11 refills | Status: DC

## 2019-11-08 MED ORDER — CALCIUM CARBONATE ANTACID 500 MG PO CHEW: 1.0000 | CHEWABLE_TABLET | Freq: Four times a day (QID) | ORAL | 0 refills | Status: AC | PRN

## 2019-11-08 MED ORDER — OMEPRAZOLE 20 MG PO CPDR: 40.0000 mg | DELAYED_RELEASE_CAPSULE | Freq: Every day | ORAL | 1 refills | Status: AC

## 2019-11-08 MED ORDER — ONDANSETRON 4 MG PO TBDP: 4.0000 mg | ORAL_TABLET | Freq: Four times a day (QID) | ORAL | 0 refills | Status: DC | PRN

## 2019-11-08 MED ORDER — PEN NEEDLES 30G X 5 MM MISC: 11 refills | Status: DC

## 2019-11-08 MED ORDER — INSULIN GLARGINE 100 UNITS/ML SOLOSTAR PEN: 40.0000 [IU] | PEN_INJECTOR | Freq: Two times a day (BID) | SUBCUTANEOUS | 11 refills | Status: DC

## 2019-11-08 MED ORDER — NOVOLOG FLEXPEN 100 UNIT/ML ~~LOC~~ SOPN: PEN_INJECTOR | SUBCUTANEOUS | 11 refills | Status: DC

## 2019-11-08 MED ORDER — IPRATROPIUM-ALBUTEROL 0.5-2.5 (3) MG/3ML IN SOLN: 3.0000 mL | Freq: Four times a day (QID) | RESPIRATORY_TRACT | 11 refills | Status: AC | PRN

## 2019-11-08 MED ORDER — INSULIN STARTER KIT- PEN NEEDLES (ENGLISH): 1.0000 | Freq: Once | 0 refills | Status: AC

## 2019-11-08 MED ORDER — LIDOCAINE HCL (PF) 1 % IJ SOLN
10.0000 mL | Freq: Once | INTRAMUSCULAR | Status: AC
  Administered 2019-11-08: 10 mL
  Filled 2019-11-08: qty 10

## 2019-11-08 MED ORDER — LORATADINE 10 MG PO TABS
10.0000 mg | ORAL_TABLET | Freq: Two times a day (BID) | ORAL | Status: DC | PRN
  Administered 2019-11-09: 10 mg via ORAL
  Filled 2019-11-08: qty 1

## 2019-11-08 MED ORDER — BLOOD GLUCOSE MONITOR KIT: PACK | 0 refills | Status: DC

## 2019-11-08 NOTE — Progress Notes (Signed)
PT Cancellation Note  Patient Details Name: Paula Gilmore MRN: [id number] DOB: 12/22/1970   Cancelled Treatment:    Reason Eval/Treat Not Completed: Patient at procedure or test/unavailable. Order received and chart reviewed. Per notes attempted LP unsuccessful. Called RN who spoke with MD who confirmed no restrictions s/p LP attempt. RN cleared pt to work with therapy. Arrived to work with pt however she is out of room. RN does not know where pt is at the moment. Will attempt PT evaluation at later date/time when pt is available.   Lyndel Safe [name] PT, DPT, GCS  Kadeem Hyle 11/08/2019, 2:54 PM

## 2019-11-08 NOTE — Procedures (Signed)
LP Procedure Note:  Patient has been seen and examined.  Chart has been reviewed.  LP is being performed to rule out BIH.  Procedure has been explained to patient/family including risks and benefits.  Consent has been signed by patient/family and witnessed. A time out was performed.  Blood pressure 130/78, pulse 79, temperature 98.3 F (36.8 C), temperature source Oral, resp. rate 18, height '5\' 6"'  (1.676 m), weight (!) 181.4 kg, last menstrual period 10/04/2019, SpO2 96 %.   Current Facility-Administered Medications:  .  acetaminophen (TYLENOL) tablet 650 mg, 650 mg, Oral, Q4H PRN, Hosie Poisson, MD, 650 mg at 11/08/19 0913 .  alum & mag hydroxide-simeth (MAALOX/MYLANTA) 200-200-20 MG/5ML suspension 30 mL, 30 mL, Oral, Q4H PRN, Hosie Poisson, MD, 30 mL at 11/06/19 0048 .  calcium carbonate (TUMS - dosed in mg elemental calcium) chewable tablet 200 mg of elemental calcium, 1 tablet, Oral, QID PRN, Sharion Settler, NP, 200 mg of elemental calcium at 11/06/19 2122 .  cephALEXin (KEFLEX) capsule 500 mg, 500 mg, Oral, Q6H, Hosie Poisson, MD, 500 mg at 11/08/19 1302 .  dextrose 50 % solution 0-50 mL, 0-50 mL, Intravenous, PRN, Beather Arbour, Jade J, MD .  dextrose 50 % solution 0-50 mL, 0-50 mL, Intravenous, PRN, Athena Masse, MD .  dicyclomine (BENTYL) capsule 10 mg, 10 mg, Oral, TID AC, Hosie Poisson, MD, 10 mg at 11/08/19 1302 .  fluconazole (DIFLUCAN) tablet 100 mg, 100 mg, Oral, Daily, Sharion Settler, NP, 100 mg at 11/07/19 2146 .  fluticasone (FLONASE) 50 MCG/ACT nasal spray 2 spray, 2 spray, Each Nare, Daily, Hosie Poisson, MD, 2 spray at 11/08/19 0919 .  furosemide (LASIX) injection 20 mg, 20 mg, Intravenous, Daily, Hosie Poisson, MD, 20 mg at 11/08/19 0918 .  insulin aspart (novoLOG) injection 0-15 Units, 0-15 Units, Subcutaneous, TID WC, Hosie Poisson, MD, 8 Units at 11/08/19 1302 .  insulin aspart (novoLOG) injection 0-5 Units, 0-5 Units, Subcutaneous, QHS, Hosie Poisson, MD, 2 Units at  11/07/19 2146 .  insulin aspart (novoLOG) injection 10 Units, 10 Units, Subcutaneous, TID WC, Hosie Poisson, MD, 10 Units at 11/08/19 1302 .  insulin glargine (LANTUS) injection 40 Units, 40 Units, Subcutaneous, BID, Hosie Poisson, MD, 40 Units at 11/08/19 0918 .  insulin starter kit- pen needles (English) 1 kit, 1 kit, Other, Once, Hosie Poisson, MD .  ipratropium-albuterol (DUONEB) 0.5-2.5 (3) MG/3ML nebulizer solution 3 mL, 3 mL, Nebulization, Q6H PRN, Hosie Poisson, MD, 3 mL at 11/06/19 1853 .  living well with diabetes book MISC, , Does not apply, Once, Hosie Poisson, MD .  loperamide (IMODIUM) capsule 2 mg, 2 mg, Oral, PRN, Hosie Poisson, MD, 2 mg at 11/07/19 1820 .  loratadine (CLARITIN) tablet 10 mg, 10 mg, Oral, Daily, Hosie Poisson, MD, 10 mg at 11/08/19 0913 .  methocarbamol (ROBAXIN) tablet 750 mg, 750 mg, Oral, Q6H PRN, Athena Masse, MD, 750 mg at 11/08/19 0913 .  nystatin (MYCOSTATIN/NYSTOP) topical powder, , Topical, TID, Hosie Poisson, MD, Given at 11/08/19 0914 .  pantoprazole (PROTONIX) EC tablet 40 mg, 40 mg, Oral, BID, Hosie Poisson, MD, 40 mg at 11/08/19 0913  Recent Labs    11/07/19 0917 11/08/19 0531  WBC 9.2  --   HGB 11.5*  --   HCT 34.7*  --   PLT 221  --   INR  --  0.9    MRI of the brain: IMPRESSION: 1. No evidence of acute intracranial abnormality. Specifically, no acute infarct. 2. Partially empty sella, which may be  an incidental finding but can be seen with idiopathic intracranial hypertension in the correct clinical setting. 3. Diffuse T1 hypointensity of the bone marrow, which is nonspecific but can be seen with chronic hypoxia (such as in smokers), chronic anemia, or lymphoproliferative disorders.  Patient was placed in the lateral decub/sitting position.  Area was cleaned with betadine and anesthetized with lidocaine.  Under sterile conditions 20G LP needle was placed at approximately L3-4 without difficulty.  No fluid could be obtained.  No  complications were noted.    Alexis Goodell, MD Neurology  11/08/2019  1:09 PM

## 2019-11-08 NOTE — Progress Notes (Signed)
*  PRELIMINARY RESULTS* Echocardiogram 2D Echocardiogram has been performed.  Sherrie Sport 11/08/2019, 1:07 PM

## 2019-11-08 NOTE — Consult Note (Addendum)
Reason for Consult:Headache Requesting Physician: Jeoffrey Massed  CC: Headache  I have been asked by Dr. Jeoffrey Massed to see this patient in consultation for rule out of BIH.  HPI: Paula Gilmore is an 49 y.o. obese female with a history of migraine, HTN, fibromyalgia and cardiomyopathy who complains of headache and blurry vision.  Patient reports that she has a history of migraine.  Pain is usually frontal or on the top of her head.  It is associated with nausea, visual scotoma, photophobia and phonophobia.  Usually relieved with a cold pack and occurs about 2 times per week.  About 2-3 weeks ago her headaches changes and she noted pain on the right side of her head and behind her right eye.  Prior to admission she also noted blurred vision but this was in the setting of elevated blood sugars with polydipsia and polyuria after being started on steroids for a PNA.  Patient reports since admission the headaches are more frequent and the vision seems to fluctuate as her blood sugars have fluctuated.     Past Medical History:  Diagnosis Date  . Acid reflux   . Arthritis   . Cancer ([place])    Cervical  . Cardiomyopathy ([place])   . COPD (chronic obstructive pulmonary disease) ([place])   . Fibromyalgia   . Hypertension   . Migraine   . Mitral valve regurgitation     Past Surgical History:  Procedure Laterality Date  . CESAREAN SECTION  2009    History reviewed. No pertinent family history.  Social History:  reports that she has quit smoking. She has never used smokeless tobacco. She reports that she does not drink alcohol and does not use drugs.  Allergies  Allergen Reactions  . Cymbalta [Duloxetine Hcl] Other (See Comments)    V-tach  . Montelukast Hives  . Tizanidine Hives  . Savella [Milnacipran Hcl] Palpitations    Medications:  I have reviewed the patient's current medications. Prior to Admission:  Medications Prior to Admission  Medication Sig Dispense Refill Last Dose  .  acetaminophen (TYLENOL) 500 MG tablet Take 1,000 mg by mouth every 6 (six) hours as needed.    prn at prn  . albuterol (PROVENTIL) (2.5 MG/3ML) 0.083% nebulizer solution Take 2.5 mg by nebulization every 6 (six) hours as needed for wheezing or shortness of breath.   prn at prn  . ascorbic acid (VITAMIN C) 500 MG tablet Take 250 mg by mouth daily.   11/03/2019 at Unknown time  . aspirin EC 81 MG tablet Take 81 mg by mouth daily. Swallow whole.   11/03/2019 at Unknown time  . cetirizine (ZYRTEC) 10 MG tablet Take 10 mg by mouth 2 (two) times daily.   11/03/2019 at Unknown time  . cholecalciferol (VITAMIN D3) 25 MCG (1000 UNIT) tablet Take 2,000 Units by mouth daily.   11/03/2019 at Unknown time  . dicyclomine (BENTYL) 10 MG capsule Take 10 mg by mouth 4 (four) times daily as needed for spasms.   prn at prn  . fluticasone (FLONASE) 50 MCG/ACT nasal spray Place 2 sprays into both nostrils daily.   11/03/2019 at Unknown time  . furosemide (LASIX) 20 MG tablet Take 20 mg by mouth daily.   11/03/2019 at Unknown time  . hydrochlorothiazide (HYDRODIURIL) 25 MG tablet Take 25 mg by mouth daily.   11/03/2019 at Unknown time  . lisinopril (ZESTRIL) 5 MG tablet Take 5 mg by mouth daily.   11/03/2019 at Unknown time  . methocarbamol (ROBAXIN) 750  MG tablet Take 750 mg by mouth 4 (four) times daily.   11/03/2019 at Unknown time  . Multiple Minerals (CALCIUM-MAGNESIUM-ZINC) TABS Take 1 tablet by mouth daily.   Unknown at Unknown  . Multiple Vitamin (MULTIVITAMIN WITH MINERALS) TABS tablet Take 1 tablet by mouth daily.   11/03/2019 at Unknown time  . omeprazole (PRILOSEC) 20 MG capsule Take 20 mg by mouth daily.   Unknown at Unknown  . SUCRALFATE PO Take 1 tablet by mouth 4 (four) times daily.    11/03/2019 at Unknown time  . loperamide (IMODIUM) 2 MG capsule Take 2 mg by mouth as needed. (Patient not taking: Reported on 11/04/2019)   Not Taking at Unknown time  . ondansetron (ZOFRAN ODT) 4 MG disintegrating tablet Take 1  tablet (4 mg total) by mouth every 6 (six) hours as needed for nausea or vomiting. (Patient not taking: Reported on 11/04/2019) 20 tablet 0 Not Taking at Unknown time   Scheduled: . cephALEXin  500 mg Oral Q6H  . dicyclomine  10 mg Oral TID AC  . fluconazole  100 mg Oral Daily  . fluticasone  2 spray Each Nare Daily  . furosemide  20 mg Intravenous Daily  . insulin aspart  0-15 Units Subcutaneous TID WC  . insulin aspart  0-5 Units Subcutaneous QHS  . insulin aspart  10 Units Subcutaneous TID WC  . insulin glargine  40 Units Subcutaneous BID  . insulin starter kit- pen needles  1 kit Other Once  . living well with diabetes book   Does not apply Once  . loratadine  10 mg Oral Daily  . nystatin   Topical TID  . pantoprazole  40 mg Oral BID    ROS: History obtained from the patient  General ROS: negative for - chills, fatigue, fever, night sweats, weight gain or weight loss Psychological ROS: depression Ophthalmic ROS: negative for - blurry vision, double vision, eye pain or loss of vision ENT ROS: negative for - epistaxis, nasal discharge, oral lesions, sore throat, tinnitus or vertigo Allergy and Immunology ROS: negative for - hives or itchy/watery eyes Hematological and Lymphatic ROS: negative for - bleeding problems, bruising or swollen lymph nodes Endocrine ROS: as noted in HPI Respiratory ROS: negative for - cough, hemoptysis, shortness of breath or wheezing Cardiovascular ROS: negative for - chest pain, dyspnea on exertion, edema or irregular heartbeat Gastrointestinal ROS: negative for - abdominal pain, diarrhea, hematemesis, nausea/vomiting or stool incontinence Genito-Urinary ROS: urinary frequency Musculoskeletal ROS: back pain, knee pain Neurological ROS: as noted in HPI Dermatological ROS: negative for rash and skin lesion changes  Physical Examination: Blood pressure 130/78, pulse 79, temperature 98.3 F (36.8 C), temperature source Oral, resp. rate 18, height 5' 6"  (1.676 m), weight (!) 181.4 kg, last menstrual period 10/04/2019, SpO2 96 %.  HEENT-  Normocephalic, no lesions, without obvious abnormality.  Normal external eye and conjunctiva.  Normal TM's bilaterally.  Normal auditory canals and external ears. Normal external nose, mucus membranes and septum.  Normal pharynx. Cardiovascular- S1, S2 normal, pulses palpable throughout   Lungs- chest clear, no wheezing, rales, normal symmetric air entry Abdomen- soft, non-tender; bowel sounds normal; no masses,  no organomegaly Extremities- BLE edema Lymph-no adenopathy palpable Musculoskeletal-no joint tenderness, deformity or swelling Skin-warm and dry, no hyperpigmentation, vitiligo, or suspicious lesions  Neurological Examination   Mental Status: Alert, oriented, thought content appropriate.  Speech fluent without evidence of aphasia.  Able to follow 3 step commands without difficulty. Cranial Nerves: II: Discs  flat bilaterally; Visual fields grossly normal, pupils equal, round, reactive to light and accommodation III,IV, VI: mild left ptosis, extra-ocular motions intact bilaterally V,VII: smile symmetric, facial light touch sensation normal bilaterally VIII: hearing normal bilaterally IX,X: gag reflex present XI: bilateral shoulder shrug XII: midline tongue extension Motor: Right : Upper extremity   5/5    Left:     Upper extremity   5/5  Lower extremity   5-/5     Lower extremity   5-/5 Tone and bulk:normal tone throughout; no atrophy noted Sensory: Pinprick and light touch intact throughout, bilaterally Deep Tendon Reflexes: Symmetric throughout Plantars: Right: mute   Left: mute Cerebellar: Normal finger-to-nose testing bilaterally Gait: not tested due to safety concerns   Laboratory Studies:   Basic Metabolic Panel: Recent Labs  Lab 11/04/19 2218 11/04/19 2218 11/05/19 0732 11/05/19 0732 11/05/19 1410 11/05/19 1410 11/05/19 2100 11/06/19 0407 11/07/19 0917  NA 132*   < >  133*  --  UNABLE TO REPORT DUE TO LIPEMIC INTERFERENCE  --  133* 131* 131*  K 3.5   < > 3.4*  --  3.8  --  3.8 3.6 3.9  CL 93*   < > 93*  --  96*  --  96* 94* 99  CO2 23   < > 26  --  18*  --  _0 GLUCOSE 187*   < > 215*  --  265*  --  243* 337* 332*  BUN 14   < > 11  --  11  --  _1 CREATININE 0.78   < > 0.75  --  0.74  --  0.77 0.76 0.63  CALCIUM 9.4   < > 9.1   < > 8.8*   < > 8.9 8.7* 8.5*  MG 2.0  --   --   --   --   --   --   --   --    < > = values in this interval not displayed.    Liver Function Tests: Recent Labs  Lab 11/07/19 0917  AST 42*  ALT 32  ALKPHOS 67  BILITOT 0.9  PROT 6.4*  ALBUMIN 3.3*   No results for input(s): LIPASE, AMYLASE in the last 168 hours. No results for input(s): AMMONIA in the last 168 hours.  CBC: Recent Labs  Lab 11/03/19 2003 11/04/19 0136 11/07/19 0917  WBC 11.9* 12.1* 9.2  HGB 14.4 13.8 11.5*  HCT 41.0 39.4 34.7*  MCV 85.8 86.0 89.4  PLT 298 284 221    Cardiac Enzymes: No results for input(s): CKTOTAL, CKMB, CKMBINDEX, TROPONINI in the last 168 hours.  BNP: Invalid input(s): POCBNP  CBG: Recent Labs  Lab 11/07/19 0739 11/07/19 1134 11/07/19 1631 11/07/19 2009 11/08/19 0723  GLUCAP 319* 355* 235* 241* 270*    Microbiology: Results for orders placed or performed during the hospital encounter of 11/04/19  Urine culture     Status: Abnormal   Collection Time: 11/03/19  8:03 PM   Specimen: Urine, Random  Result Value Ref Range Status   Specimen Description   Final    URINE, RANDOM Performed at [facility], [address] [place], [place] [address]    Special Requests   Final    NONE Performed at [facility], [address] [place], [place] [address]    Culture MULTIPLE SPECIES PRESENT, SUGGEST RECOLLECTION (A)  Final   Report Status 11/06/2019 FINAL  Final  Respiratory Panel by RT PCR (  Flu A&B, Covid) - Nasopharyngeal Swab     Status: None   Collection Time: 11/04/19 12:44 AM    Specimen: Nasopharyngeal Swab  Result Value Ref Range Status   SARS Coronavirus 2 by RT PCR NEGATIVE NEGATIVE Final    Comment: (NOTE) SARS-CoV-2 target nucleic acids are NOT DETECTED.  The SARS-CoV-2 RNA is generally detectable in upper respiratoy specimens during the acute phase of infection. The lowest concentration of SARS-CoV-2 viral copies this assay can detect is 131 copies/mL. A negative result does not preclude SARS-Cov-2 infection and should not be used as the sole basis for treatment or other patient management decisions. A negative result may occur with  improper specimen collection/handling, submission of specimen other than nasopharyngeal swab, presence of viral mutation(s) within the areas targeted by this assay, and inadequate number of viral copies (<131 copies/mL). A negative result must be combined with clinical observations, patient history, and epidemiological information. The expected result is Negative.  Fact Sheet for Patients:  PinkCheek.be  Fact Sheet for Healthcare Providers:  GravelBags.it  This test is no t yet approved or cleared by the [place] FDA and  has been authorized for detection and/or diagnosis of SARS-CoV-2 by FDA under an Emergency Use Authorization (EUA). This EUA will remain  in effect (meaning this test can be used) for the duration of the COVID-19 declaration under Section 564(b)(1) of the Act, 21 U.S.C. section 360bbb-3(b)(1), unless the authorization is terminated or revoked sooner.     Influenza A by PCR NEGATIVE NEGATIVE Final   Influenza B by PCR NEGATIVE NEGATIVE Final    Comment: (NOTE) The Xpert Xpress SARS-CoV-2/FLU/RSV assay is intended as an aid in  the diagnosis of influenza from Nasopharyngeal swab specimens and  should not be used as a sole basis for treatment. Nasal washings and  aspirates are unacceptable for Xpert Xpress SARS-CoV-2/FLU/RSV   testing.  Fact Sheet for Patients: PinkCheek.be  Fact Sheet for Healthcare Providers: GravelBags.it  This test is not yet approved or cleared by the [place] FDA and  has been authorized for detection and/or diagnosis of SARS-CoV-2 by  FDA under an Emergency Use Authorization (EUA). This EUA will remain  in effect (meaning this test can be used) for the duration of the  Covid-19 declaration under Section 564(b)(1) of the Act, 21  U.S.C. section 360bbb-3(b)(1), unless the authorization is  terminated or revoked. Performed at [facility], [address] [place], [place] [address]     Coagulation Studies: Recent Labs    11/08/19 0531  LABPROT 11.8  INR 0.9    Urinalysis:  Recent Labs  Lab 11/03/19 2003  COLORURINE STRAW*  LABSPEC 1.027  PHURINE 5.0  GLUCOSEU >=500*  HGBUR MODERATE*  BILIRUBINUR NEGATIVE  KETONESUR 5*  PROTEINUR NEGATIVE  NITRITE NEGATIVE  LEUKOCYTESUR SMALL*    Lipid Panel:  No results found for: CHOL, TRIG, HDL, CHOLHDL, VLDL, LDLCALC  HgbA1C:  Lab Results  Component Value Date   HGBA1C 11.4 (H) 11/04/2019    Urine Drug Screen:  No results found for: LABOPIA, COCAINSCRNUR, LABBENZ, AMPHETMU, THCU, LABBARB  Alcohol Level: No results for input(s): ETH in the last 168 hours.   Imaging: MR BRAIN WO CONTRAST  Result Date: 11/07/2019 CLINICAL DATA:  Dizziness, persistence/recurrence.  Blurry vision. EXAM: MRI HEAD WITHOUT CONTRAST TECHNIQUE: Multiplanar, multiecho pulse sequences of the brain and surrounding structures were obtained without intravenous contrast. COMPARISON:  None. FINDINGS: Brain: No acute infarction, hemorrhage, hydrocephalus, extra-axial collection or mass lesion. Dilated perivascular space  in the inferior left basal ganglia. Mild scattered T2/FLAIR hyperintensities within the subcortical and periventricular white matter, most likely secondary to chronic  microvascular ischemic disease and not particularly advanced for age. Partially empty sella. Vascular: Major proximal arterial flow voids are maintained at the skull base. Skull and upper cervical spine: Diffuse T1 hypointensity of the marrow. Sinuses/Orbits: Minimal ethmoid air cell mucosal thickening. Otherwise, the sinuses are clear. No acute orbital abnormality. Other: Small bilateral mastoid effusions. IMPRESSION: 1. No evidence of acute intracranial abnormality. Specifically, no acute infarct. 2. Partially empty sella, which may be an incidental finding but can be seen with idiopathic intracranial hypertension in the correct clinical setting. 3. Diffuse T1 hypointensity of the bone marrow, which is nonspecific but can be seen with chronic hypoxia (such as in smokers), chronic anemia, or lymphoproliferative disorders. Electronically Signed   By: Margaretha Sheffield MD   On: 11/07/2019 08:23     Assessment/Plan: 49 y.o. obese female with a history of migraine, HTN, fibromyalgia and cardiomyopathy who presented in DKA but also complains of headache and blurry vision.  Patient reports that she has a history of migraine.  Current headaches are different.  MRI of the brain without contrast personally reviewed.  No acute changes noted.  There was mention of a partially empty sella and a concern for BIH was raised.  Although it is likely that her current symptoms are likely related to her poorly controlled blood sugar, particularly when considering her normal discs, there is no way to truly rule out BIH without a spinal tap.  Patient is obese and spinal tap was unsuccessful at the bedside.   ESR elevated at 63.  CRP pending  Recommendations: 1. LP with opening pressure to be documented.  Would send CSF for cell count, protein, glucose and bacterial culture/gram stain 2. CRP pending.   3. Agree with continued attempts at blood sugar control 4. Ophthalmology evaluation.     Alexis Goodell,  MD Neurology [phone] 11/08/2019, 12:03 PM

## 2019-11-08 NOTE — Progress Notes (Signed)
PROGRESS NOTE    Paula Gilmore  OMB:[id number] DOB: Apr 04, 1970 DOA: 11/04/2019 PCP: Lawernce Pitts, FNP    Chief Complaint  Patient presents with  . Hyperglycemia    Brief Narrative:  49 year old lady with prior hypertension, MORBID OBESITY,fibromyalgia, presents to ED with blurred vision, frequent urination, increased thirst, and on for a few weeks was found to be in DKA on arrival to ED.  She was admitted for evaluation and management of DKA and new onset DM. Though her gap was closed and bicarb improved her cbgs have been running very high. She is currently requiring upto 40 units of Insulin BID to keep her cbgs around 200. During her hospitalization, pt had some blurry vision, intermittent headaches , . MRI of the head without contrast obtained , did reveal empty sella and possible intra cranial hypertension. Neurology consulted to see if can check opening pressure at LP. Bed side LP was unsuccessful, she is scheduled for fluoro guided LP later today.   Patient can possibly discharged tomorrow.   Assessment & Plan:   Principal Problem:   DKA (diabetic ketoacidosis) ([place]) Active Problems:   Sleep apnea   Morbid obesity with BMI of 60.0-69.9, adult (HCC)   Chronic pain   HTN (hypertension)   UTI (urinary tract infection)   New onset type 2 diabetes mellitus ([place])    DKA: Resolved. She was started on lantus and increased the dose to  40 units BID, added Novolog to 10 units [facility] and continue with resistant scale SSI.  CBG (last 3)  Recent Labs    11/07/19 2009 11/08/19 0723 11/08/19 1145  GLUCAP 241* 270* 294*   Anion gap is closed and  bicarb within normal limits. Diabetes education/coordinator consulted and recommendations given.    Essential hypertension BP parameters are well controlled.     Frequent urination and abnormal UA, will treat for urinary tract infection  Urine cultures show multiple species.  Changed to oral keflex to complete the  course. She will have completed the course by today.     Morbid obesity Recommend referral to bariatric surgery for weight loss.. Body mass index is 64.56 kg/m. Dietary consulted.    Blurry vision and dizziness for a few weeks now/ persistent daily headaches/ with a h/o Migraines. No falls or loss of consciousness MRI of the brain without contrast ordered for further evaluation. Its shows empty sella, possible intracranial hypertension.  Neurology consulted to see if we can do LP and check opening pressure. Bed side LP unsuccessful, she is scheduled for Fluoro guided LP later today.  Hold lovenox and aspirin for the procedure.  Recommend outpatient follow up with ophthalmology.    Right ankle sprain X-rays of the ankle negative for fracture show some soft tissue swelling. Pain control .     Hypokalemia Replaced.    Hyponatremia probably from hyperglycemia. Recheck bmp in am.    COPD:  Some dyspnea, pt on lasix at home, will get BNP and echocardiogram to rule out diastolic CHF.  Resume duonebs as needed.    Leukocytosis probably from UTI/reactive. Resolved with antibiotics.    Itching in the groin area:  Candidiasis of the groin area.  Started her on diflucan.  Nystatin powder.    GERD;  Stable, started her on protonix.     DVT prophylaxis: (Lovenox) Code Status: (Full code.  Family Communication: none at bedside.  Disposition:   Status is: Inpatient  Remains inpatient appropriate because:Ongoing diagnostic testing needed not appropriate for outpatient work  up, IV treatments appropriate due to intensity of illness or inability to take PO and Inpatient level of care appropriate due to severity of illness   Dispo: The patient is from: Home              Anticipated d/c is to: Home              Anticipated d/c date is: 1 day              Patient currently is not medically stable to d/c.       Consultants:   Neurology    Procedures: MRI brain  without contrast.   Antimicrobials: none.   Subjective:  No chest pain or sob, no nausea, vomiting or abd pain.   Objective: Vitals:   11/07/19 1654 11/07/19 2043 11/08/19 0456 11/08/19 1144  BP: 132/79 (!) 144/85 132/86 130/78  Pulse: 87 79 81 79  Resp: '18 20 20 18  ' Temp: 98.4 F (36.9 C) 98 F (36.7 C) 97.9 F (36.6 C) 98.3 F (36.8 C)  TempSrc: Oral Oral Oral Oral  SpO2: 97% 100% 97% 96%  Weight:      Height:        Intake/Output Summary (Last 24 hours) at 11/08/2019 1613 Last data filed at 11/07/2019 2130 Gross per 24 hour  Intake 960 ml  Output 1000 ml  Net -40 ml   Filed Weights   11/03/19 1955  Weight: (!) 181.4 kg    Examination:  General exam: Morbidly obese lady, not in distress. On RA.   Respiratory system: diminished air entry at bases, no wheezing or rhonchi.  Cardiovascular system: S1S2, RRR, no JVD, pedal edema present.  Gastrointestinal system: abdomen is soft, obese, non tender bowel sounds wnl.  Central nervous system: alert and oriented, grossly non focal,  Extremities: no cyanosis . Leg edema present.  Skin: no ulcers or lesions.  Psychiatry:  Mood is appropriate.     Data Reviewed: I have personally reviewed following labs and imaging studies  CBC: Recent Labs  Lab 11/03/19 2003 11/04/19 0136 11/07/19 0917  WBC 11.9* 12.1* 9.2  HGB 14.4 13.8 11.5*  HCT 41.0 39.4 34.7*  MCV 85.8 86.0 89.4  PLT 298 284 009    Basic Metabolic Panel: Recent Labs  Lab 11/04/19 2218 11/04/19 2218 11/05/19 0732 11/05/19 1410 11/05/19 2100 11/06/19 0407 11/07/19 0917  NA 132*   < > 133* UNABLE TO REPORT DUE TO LIPEMIC INTERFERENCE 133* 131* 131*  K 3.5   < > 3.4* 3.8 3.8 3.6 3.9  CL 93*   < > 93* 96* 96* 94* 99  CO2 23   < > 26 18* '23 23 23  ' GLUCOSE 187*   < > 215* 265* 243* 337* 332*  BUN 14   < > '11 11 8 8 7  ' CREATININE 0.78   < > 0.75 0.74 0.77 0.76 0.63  CALCIUM 9.4   < > 9.1 8.8* 8.9 8.7* 8.5*  MG 2.0  --   --   --   --   --   --     < > = values in this interval not displayed.    GFR: Estimated Creatinine Clearance: 145.2 mL/min (by C-G formula based on SCr of 0.63 mg/dL).  Liver Function Tests: Recent Labs  Lab 11/07/19 0917  AST 42*  ALT 32  ALKPHOS 67  BILITOT 0.9  PROT 6.4*  ALBUMIN 3.3*    CBG: Recent Labs  Lab 11/07/19 1134 11/07/19 1631 11/07/19  2009 11/08/19 0723 11/08/19 1145  GLUCAP 355* 235* 241* 270* 294*     Recent Results (from the past 240 hour(s))  Urine culture     Status: Abnormal   Collection Time: 11/03/19  8:03 PM   Specimen: Urine, Random  Result Value Ref Range Status   Specimen Description   Final    URINE, RANDOM Performed at [facility], [address], [place], [place] [address]    Special Requests   Final    NONE Performed at [facility], [address] [place], [place] [address]    Culture MULTIPLE SPECIES PRESENT, SUGGEST RECOLLECTION (A)  Final   Report Status 11/06/2019 FINAL  Final  Respiratory Panel by RT PCR (Flu A&B, Covid) - Nasopharyngeal Swab     Status: None   Collection Time: 11/04/19 12:44 AM   Specimen: Nasopharyngeal Swab  Result Value Ref Range Status   SARS Coronavirus 2 by RT PCR NEGATIVE NEGATIVE Final    Comment: (NOTE) SARS-CoV-2 target nucleic acids are NOT DETECTED.  The SARS-CoV-2 RNA is generally detectable in upper respiratoy specimens during the acute phase of infection. The lowest concentration of SARS-CoV-2 viral copies this assay can detect is 131 copies/mL. A negative result does not preclude SARS-Cov-2 infection and should not be used as the sole basis for treatment or other patient management decisions. A negative result may occur with  improper specimen collection/handling, submission of specimen other than nasopharyngeal swab, presence of viral mutation(s) within the areas targeted by this assay, and inadequate number of viral copies (<131 copies/mL). A negative result must be combined with  clinical observations, patient history, and epidemiological information. The expected result is Negative.  Fact Sheet for Patients:  PinkCheek.be  Fact Sheet for Healthcare Providers:  GravelBags.it  This test is no t yet approved or cleared by the [place] FDA and  has been authorized for detection and/or diagnosis of SARS-CoV-2 by FDA under an Emergency Use Authorization (EUA). This EUA will remain  in effect (meaning this test can be used) for the duration of the COVID-19 declaration under Section 564(b)(1) of the Act, 21 U.S.C. section 360bbb-3(b)(1), unless the authorization is terminated or revoked sooner.     Influenza A by PCR NEGATIVE NEGATIVE Final   Influenza B by PCR NEGATIVE NEGATIVE Final    Comment: (NOTE) The Xpert Xpress SARS-CoV-2/FLU/RSV assay is intended as an aid in  the diagnosis of influenza from Nasopharyngeal swab specimens and  should not be used as a sole basis for treatment. Nasal washings and  aspirates are unacceptable for Xpert Xpress SARS-CoV-2/FLU/RSV  testing.  Fact Sheet for Patients: PinkCheek.be  Fact Sheet for Healthcare Providers: GravelBags.it  This test is not yet approved or cleared by the [place] FDA and  has been authorized for detection and/or diagnosis of SARS-CoV-2 by  FDA under an Emergency Use Authorization (EUA). This EUA will remain  in effect (meaning this test can be used) for the duration of the  Covid-19 declaration under Section 564(b)(1) of the Act, 21  U.S.C. section 360bbb-3(b)(1), unless the authorization is  terminated or revoked. Performed at [facility], [address], [place], [place] [address]          Radiology Studies: MR BRAIN WO CONTRAST  Result Date: 11/07/2019 CLINICAL DATA:  Dizziness, persistence/recurrence.  Blurry vision. EXAM: MRI HEAD WITHOUT CONTRAST  TECHNIQUE: Multiplanar, multiecho pulse sequences of the brain and surrounding structures were obtained without intravenous contrast. COMPARISON:  None. FINDINGS: Brain: No  acute infarction, hemorrhage, hydrocephalus, extra-axial collection or mass lesion. Dilated perivascular space in the inferior left basal ganglia. Mild scattered T2/FLAIR hyperintensities within the subcortical and periventricular white matter, most likely secondary to chronic microvascular ischemic disease and not particularly advanced for age. Partially empty sella. Vascular: Major proximal arterial flow voids are maintained at the skull base. Skull and upper cervical spine: Diffuse T1 hypointensity of the marrow. Sinuses/Orbits: Minimal ethmoid air cell mucosal thickening. Otherwise, the sinuses are clear. No acute orbital abnormality. Other: Small bilateral mastoid effusions. IMPRESSION: 1. No evidence of acute intracranial abnormality. Specifically, no acute infarct. 2. Partially empty sella, which may be an incidental finding but can be seen with idiopathic intracranial hypertension in the correct clinical setting. 3. Diffuse T1 hypointensity of the bone marrow, which is nonspecific but can be seen with chronic hypoxia (such as in smokers), chronic anemia, or lymphoproliferative disorders. Electronically Signed   By: Margaretha Sheffield MD   On: 11/07/2019 08:23   ECHOCARDIOGRAM COMPLETE  Result Date: 11/08/2019    ECHOCARDIOGRAM REPORT   Patient Name:   LIZ PINHO [facility] Date of Exam: 11/08/2019 Medical Rec #:  [id number]             Height:       66.0 in Accession #:    [id number]            Weight:       400.0 lb Date of Birth:  1970-03-24            BSA:          2.685 m Patient Age:    48 years              BP:           132/86 mmHg Patient Gender: F                     HR:           81 bpm. Exam Location:  [facility] Procedure: 2D Echo, Cardiac Doppler and Color Doppler Indications:     Dyspnea 786.09  History:         Patient has  prior history of Echocardiogram examinations, most                  recent 09/24/2016. Cardiomyopathy, COPD; Risk                  Factors:Hypertension. Mitral valve regurgitation.  Sonographer:     Sherrie Sport RDCS (AE) Referring Phys:  Theola Sequin Diagnosing Phys: Nelva Bush MD IMPRESSIONS  1. Left ventricular ejection fraction, by estimation, is 60 to 65%. The left ventricle has normal function. The left ventricle has no regional wall motion abnormalities. There is moderate left ventricular hypertrophy. Left ventricular diastolic parameters were normal.  2. Right ventricular systolic function is normal. The right ventricular size is normal.  3. Right atrial size was mildly dilated.  4. The mitral valve is grossly normal. No evidence of mitral valve regurgitation.  5. The aortic valve has an indeterminant number of cusps. Aortic valve regurgitation is not visualized. No aortic stenosis is present. FINDINGS  Left Ventricle: Left ventricular ejection fraction, by estimation, is 60 to 65%. The left ventricle has normal function. The left ventricle has no regional wall motion abnormalities. The left ventricular internal cavity size was normal in size. There is  moderate left ventricular hypertrophy. Left ventricular diastolic parameters were normal. Right Ventricle: The right ventricular size is  normal. No increase in right ventricular wall thickness. Right ventricular systolic function is normal. Left Atrium: Left atrial size was normal in size. Right Atrium: Right atrial size was mildly dilated. Pericardium: The pericardium was not well visualized. Mitral Valve: The mitral valve is grossly normal. No evidence of mitral valve regurgitation. Tricuspid Valve: The tricuspid valve is not well visualized. Tricuspid valve regurgitation is trivial. Aortic Valve: The aortic valve has an indeterminant number of cusps. Aortic valve regurgitation is not visualized. No aortic stenosis is present. Aortic valve mean  gradient measures 9.3 mmHg. Aortic valve peak gradient measures 17.4 mmHg. Aortic valve area, by VTI measures 1.98 cm. Pulmonic Valve: The pulmonic valve was not well visualized. Pulmonic valve regurgitation is not visualized. No evidence of pulmonic stenosis. Aorta: The aortic root is normal in size and structure. Pulmonary Artery: The pulmonary artery is of normal size. Venous: The inferior vena cava was not well visualized. IAS/Shunts: The interatrial septum was not well visualized.  LEFT VENTRICLE PLAX 2D LVIDd:         4.79 cm  Diastology LVIDs:         2.97 cm  LV e' medial:    8.27 cm/s LV PW:         1.46 cm  LV E/e' medial:  11.4 LV IVS:        1.41 cm  LV e' lateral:   12.00 cm/s LVOT diam:     2.00 cm  LV E/e' lateral: 7.9 LV SV:         67 LV SV Index:   25 LVOT Area:     3.14 cm  RIGHT VENTRICLE RV Basal diam:  4.02 cm RV S prime:     17.80 cm/s TAPSE (M-mode): 3.8 cm LEFT ATRIUM             Index       RIGHT ATRIUM           Index LA diam:        3.80 cm 1.42 cm/m  RA Area:     20.10 cm LA Vol (A2C):   46.2 ml 17.21 ml/m RA Volume:   60.50 ml  22.53 ml/m LA Vol (A4C):   36.6 ml 13.63 ml/m LA Biplane Vol: 42.3 ml 15.75 ml/m  AORTIC VALVE                    PULMONIC VALVE AV Area (Vmax):    1.60 cm     PV Vmax:        0.99 m/s AV Area (Vmean):   1.57 cm     PV Peak grad:   4.0 mmHg AV Area (VTI):     1.98 cm     RVOT Peak grad: 4 mmHg AV Vmax:           208.33 cm/s AV Vmean:          140.333 cm/s AV VTI:            0.340 m AV Peak Grad:      17.4 mmHg AV Mean Grad:      9.3 mmHg LVOT Vmax:         106.00 cm/s LVOT Vmean:        70.000 cm/s LVOT VTI:          0.214 m LVOT/AV VTI ratio: 0.63  AORTA Ao Root diam: 2.80 cm MITRAL VALVE  TRICUSPID VALVE MV Area (PHT): 2.62 cm    TR Peak grad:   18.0 mmHg MV Decel Time: 289 msec    TR Vmax:        212.00 cm/s MV E velocity: 94.60 cm/s MV A velocity: 80.00 cm/s  SHUNTS MV E/A ratio:  1.18        Systemic VTI:  0.21 m                             Systemic Diam: 2.00 cm Nelva Bush MD Electronically signed by Nelva Bush MD Signature Date/Time: 11/08/2019/3:41:14 PM    Final    DG FL GUIDED LUMBAR PUNCTURE  Result Date: 11/08/2019 CLINICAL DATA:  Blurry vision, possible idiopathic intracranial hypertension, failed bedside LP attempt EXAM: DIAGNOSTIC LUMBAR PUNCTURE UNDER FLUOROSCOPIC GUIDANCE FLUOROSCOPY TIME:  Fluoroscopy Time: 0 seconds PROCEDURE: Informed consent was obtained from the patient prior to the procedure, including potential complications of headache, allergy, and pain. With the patient prone, the lower back was prepped with Betadine. 1% Lidocaine was used for local anesthesia. Due to body habitus, radiographs were obtained rather than live fluoroscopy, which did not for sufficient visualization of anatomy. Lumbar puncture was attempted at the L2-L3 and L4-L5 levels using a 22 gauge 7 inch needle. The subarachnoid space could not be successfully accessed. The patient tolerated the procedure well and there were no apparent complications. IMPRESSION: Technically unsuccessful image guided lumbar puncture. Electronically Signed   By: Macy Mis M.D.   On: 11/08/2019 15:37        Scheduled Meds: . cephALEXin  500 mg Oral Q6H  . dicyclomine  10 mg Oral TID AC  . fluconazole  100 mg Oral Daily  . fluticasone  2 spray Each Nare Daily  . furosemide  20 mg Intravenous Daily  . insulin aspart  0-15 Units Subcutaneous TID WC  . insulin aspart  0-5 Units Subcutaneous QHS  . insulin aspart  10 Units Subcutaneous TID WC  . insulin glargine  40 Units Subcutaneous BID  . insulin starter kit- pen needles  1 kit Other Once  . living well with diabetes book   Does not apply Once  . loratadine  10 mg Oral Daily  . nystatin   Topical TID  . pantoprazole  40 mg Oral BID   Continuous Infusions:    LOS: 4 days        Hosie Poisson, MD [facility]   To contact the attending provider between 7A-7P or the  covering provider during after hours 7P-7A, please log into the web site [email or web address] and access using universal [place] password for that web site. If you do not have the password, please call the hospital operator.  11/08/2019, 4:13 PM

## 2019-11-08 NOTE — Care Management (Signed)
PT eval pending.  Provided patient with Resources for Nutrition and Diabetes Education Services at [facility]

## 2019-11-09 DIAGNOSIS — G4730 Sleep apnea, unspecified: Secondary | ICD-10-CM

## 2019-11-09 DIAGNOSIS — R739 Hyperglycemia, unspecified: Secondary | ICD-10-CM

## 2019-11-09 DIAGNOSIS — E119 Type 2 diabetes mellitus without complications: Secondary | ICD-10-CM

## 2019-11-09 DIAGNOSIS — R519 Headache, unspecified: Secondary | ICD-10-CM

## 2019-11-09 DIAGNOSIS — E1110 Type 2 diabetes mellitus with ketoacidosis without coma: Secondary | ICD-10-CM | POA: Diagnosis not present

## 2019-11-09 DIAGNOSIS — G894 Chronic pain syndrome: Secondary | ICD-10-CM

## 2019-11-09 DIAGNOSIS — H538 Other visual disturbances: Secondary | ICD-10-CM | POA: Diagnosis not present

## 2019-11-09 DIAGNOSIS — G8929 Other chronic pain: Secondary | ICD-10-CM

## 2019-11-09 DIAGNOSIS — N390 Urinary tract infection, site not specified: Secondary | ICD-10-CM

## 2019-11-09 LAB — GLUCOSE, CAPILLARY
Glucose-Capillary: 232 mg/dL — ABNORMAL HIGH (ref 70–99)
Glucose-Capillary: 256 mg/dL — ABNORMAL HIGH (ref 70–99)
Glucose-Capillary: 156 mg/dL — ABNORMAL HIGH (ref 70–99)
Glucose-Capillary: 181 mg/dL — ABNORMAL HIGH (ref 70–99)

## 2019-11-09 MED ORDER — METHOCARBAMOL 500 MG PO TABS
750.0000 mg | ORAL_TABLET | Freq: Four times a day (QID) | ORAL | Status: AC | PRN
  Administered 2019-11-09 – 2019-11-10 (×2): 750 mg via ORAL
  Filled 2019-11-09 (×2): qty 2

## 2019-11-09 MED ORDER — INSULIN DETEMIR 100 UNIT/ML ~~LOC~~ SOLN
45.0000 [IU] | Freq: Two times a day (BID) | SUBCUTANEOUS | Status: DC
  Filled 2019-11-09: qty 0.45

## 2019-11-09 MED ORDER — PANTOPRAZOLE SODIUM 40 MG PO TBEC
40.0000 mg | DELAYED_RELEASE_TABLET | Freq: Every day | ORAL | Status: DC
  Administered 2019-11-10: 40 mg via ORAL
  Filled 2019-11-09: qty 1

## 2019-11-09 MED ORDER — INSULIN STARTER KIT- SYRINGES (ENGLISH)
1.0000 | Freq: Once | Status: AC
  Administered 2019-11-09: 1
  Filled 2019-11-09: qty 1

## 2019-11-09 MED ORDER — INSULIN NPH (HUMAN) (ISOPHANE) 100 UNIT/ML ~~LOC~~ SUSP: 45.0000 [IU] | Freq: Two times a day (BID) | SUBCUTANEOUS | Status: DC

## 2019-11-09 MED ORDER — LIVING WELL WITH DIABETES BOOK
Freq: Once | Status: AC
  Filled 2019-11-09: qty 1

## 2019-11-09 MED ORDER — INSULIN ASPART PROT & ASPART (70-30 MIX) 100 UNIT/ML ~~LOC~~ SUSP
40.0000 [IU] | Freq: Two times a day (BID) | SUBCUTANEOUS | Status: DC
  Administered 2019-11-09: 40 [IU] via SUBCUTANEOUS
  Filled 2019-11-09: qty 10

## 2019-11-09 MED ORDER — LISINOPRIL 10 MG PO TABS
5.0000 mg | ORAL_TABLET | Freq: Every day | ORAL | Status: DC
  Administered 2019-11-09 – 2019-11-10 (×2): 5 mg via ORAL
  Filled 2019-11-09 (×2): qty 1

## 2019-11-09 MED ORDER — ASPIRIN EC 81 MG PO TBEC
81.0000 mg | DELAYED_RELEASE_TABLET | Freq: Every day | ORAL | Status: DC
  Administered 2019-11-09 – 2019-11-10 (×2): 81 mg via ORAL
  Filled 2019-11-09 (×2): qty 1

## 2019-11-09 MED ORDER — ENOXAPARIN SODIUM 100 MG/ML ~~LOC~~ SOLN
0.5000 mg/kg | SUBCUTANEOUS | Status: DC
  Administered 2019-11-09: 90 mg via SUBCUTANEOUS
  Filled 2019-11-09 (×2): qty 1

## 2019-11-09 MED ORDER — INSULIN DETEMIR 100 UNIT/ML ~~LOC~~ SOLN
55.0000 [IU] | Freq: Two times a day (BID) | SUBCUTANEOUS | Status: DC
  Filled 2019-11-09 (×2): qty 0.55

## 2019-11-09 NOTE — Progress Notes (Signed)
Inpatient Diabetes Program Recommendations  AACE/ADA: New Consensus Statement on Inpatient Glycemic Control (2015)  Target Ranges:  Prepandial:   less than 140 mg/dL      Peak postprandial:   less than 180 mg/dL (1-2 hours)      Critically ill patients:  140 - 180 mg/dL   Lab Results  Component Value Date   GLUCAP 256 (H) 11/09/2019   HGBA1C 11.4 (H) 11/04/2019    Review of Glycemic Control  Diabetes history: New Onset Current orders for Inpatient glycemic control: Current Insulin Orders: Lantuts 40 units bid Novolog 0-15 units tid + hs Novolog 10 units tid meal coverage  Inpatient Diabetes Program Recommendations:   Spoke with patient @ bedside and reviewed injections with insulin syringe. Patient states she prefers use of the insulin syringes. Sent secure chat to RN Iver Nestle requesting instruction of insulin injections and to allow patient to give own injections to prepare for home care.  Thank you, Nani Gasser. Zeke Aker, RN, MSN, CDE  Diabetes Coordinator Inpatient Glycemic Control Team Team Pager [phone] (8am-5pm) 11/09/2019 1:21 PM

## 2019-11-09 NOTE — Progress Notes (Addendum)
Subjective: Patient reports that her vision continues to fluctuate but reports that it is somewhat improved at this time.  Objective: Current vital signs: BP (!) 141/91 (BP Location: Right Arm)   Pulse 84   Temp 99 F (37.2 C)   Resp 20   Ht $R'5\' 6"'rR$  (1.676 m)   Wt (!) 181.4 kg   LMP 10/04/2019 (Approximate)   SpO2 98%   BMI 64.56 kg/m  Vital signs in last 24 hours: Temp:  [97.8 F (36.6 C)-99 F (37.2 C)] 99 F (37.2 C) (10/13 0600) Pulse Rate:  [79-84] 84 (10/13 0600) Resp:  [18-20] 20 (10/13 0600) BP: (114-141)/(63-91) 141/91 (10/13 0600) SpO2:  [96 %-100 %] 98 % (10/13 0600)  Intake/Output from previous day: 10/12 0701 - 10/13 0700 In: -  Out: 4500 [Urine:4500] Intake/Output this shift: Total I/O In: -  Out: 250 [Urine:250] Nutritional status:  Diet Order            Diet - low sodium heart healthy           Diet Carb Modified Fluid consistency: Thin; Room service appropriate? Yes  Diet effective now                 Neurologic Exam: Mental Status: Alert, oriented, thought content appropriate.  Speech fluent without evidence of aphasia.  Able to follow 3 step commands without difficulty. Cranial Nerves: II: Discs flat bilaterally; Visual fields grossly normal, pupils equal, round, reactive to light and accommodation.  Visual acuity 20/40 with both eyes open using handheld Snellins III,IV, VI: mild left ptosis, extra-ocular motions intact bilaterally V,VII: smile symmetric, facial light touch sensation normal bilaterally VIII: hearing normal bilaterally IX,X: gag reflex present XI: bilateral shoulder shrug XII: midline tongue extension Motor: Right :  Upper extremity   5/5                                      Left:     Upper extremity   5/5             Lower extremity   5-/5                                                 Lower extremity   5-/5 Tone and bulk:normal tone throughout; no atrophy noted Sensory: Pinprick and light touch intact throughout,  bilaterally   Lab Results: Basic Metabolic Panel: Recent Labs  Lab 11/04/19 2218 11/04/19 2218 11/05/19 0732 11/05/19 0732 11/05/19 1410 11/05/19 1410 11/05/19 2100 11/06/19 0407 11/07/19 0917  NA 132*   < > 133*  --  UNABLE TO REPORT DUE TO LIPEMIC INTERFERENCE  --  133* 131* 131*  K 3.5   < > 3.4*  --  3.8  --  3.8 3.6 3.9  CL 93*   < > 93*  --  96*  --  96* 94* 99  CO2 23   < > 26  --  18*  --  $R'23 23 23  'hO$ GLUCOSE 187*   < > 215*  --  265*  --  243* 337* 332*  BUN 14   < > 11  --  11  --  $R'8 8 7  'gY$ CREATININE 0.78   < > 0.75  --  0.74  --  0.77 0.76 0.63  CALCIUM 9.4   < > 9.1   < > 8.8*   < > 8.9 8.7* 8.5*  MG 2.0  --   --   --   --   --   --   --   --    < > = values in this interval not displayed.    Liver Function Tests: Recent Labs  Lab 11/07/19 0917  AST 42*  ALT 32  ALKPHOS 67  BILITOT 0.9  PROT 6.4*  ALBUMIN 3.3*   No results for input(s): LIPASE, AMYLASE in the last 168 hours. No results for input(s): AMMONIA in the last 168 hours.  CBC: Recent Labs  Lab 11/03/19 2003 11/04/19 0136 11/07/19 0917  WBC 11.9* 12.1* 9.2  HGB 14.4 13.8 11.5*  HCT 41.0 39.4 34.7*  MCV 85.8 86.0 89.4  PLT 298 284 221    Cardiac Enzymes: No results for input(s): CKTOTAL, CKMB, CKMBINDEX, TROPONINI in the last 168 hours.  Lipid Panel: No results for input(s): CHOL, TRIG, HDL, CHOLHDL, VLDL, LDLCALC in the last 168 hours.  CBG: Recent Labs  Lab 11/08/19 0723 11/08/19 1145 11/08/19 1703 11/08/19 2101 11/09/19 0746  GLUCAP 270* 294* 193* 209* 232*    Microbiology: Results for orders placed or performed during the hospital encounter of 11/04/19  Urine culture     Status: Abnormal   Collection Time: 11/03/19  8:03 PM   Specimen: Urine, Random  Result Value Ref Range Status   Specimen Description   Final    URINE, RANDOM Performed at [facility], [address] [place], [place] [address]    Special Requests   Final    NONE Performed at [facility], [address] [place], [place] [address]    Culture MULTIPLE SPECIES PRESENT, SUGGEST RECOLLECTION (A)  Final   Report Status 11/06/2019 FINAL  Final  Respiratory Panel by RT PCR (Flu A&B, Covid) - Nasopharyngeal Swab     Status: None   Collection Time: 11/04/19 12:44 AM   Specimen: Nasopharyngeal Swab  Result Value Ref Range Status   SARS Coronavirus 2 by RT PCR NEGATIVE NEGATIVE Final    Comment: (NOTE) SARS-CoV-2 target nucleic acids are NOT DETECTED.  The SARS-CoV-2 RNA is generally detectable in upper respiratoy specimens during the acute phase of infection. The lowest concentration of SARS-CoV-2 viral copies this assay can detect is 131 copies/mL. A negative result does not preclude SARS-Cov-2 infection and should not be used as the sole basis for treatment or other patient management decisions. A negative result may occur with  improper specimen collection/handling, submission of specimen other than nasopharyngeal swab, presence of viral mutation(s) within the areas targeted by this assay, and inadequate number of viral copies (<131 copies/mL). A negative result must be combined with clinical observations, patient history, and epidemiological information. The expected result is Negative.  Fact Sheet for Patients:  PinkCheek.be  Fact Sheet for Healthcare Providers:  GravelBags.it  This test is no t yet approved or cleared by the [place] FDA and  has been authorized for detection and/or diagnosis of SARS-CoV-2 by FDA under an Emergency Use Authorization (EUA). This EUA will remain  in effect (meaning this test can be used) for the duration of the COVID-19 declaration under Section 564(b)(1) of the Act, 21 U.S.C. section 360bbb-3(b)(1), unless the authorization is terminated or revoked sooner.     Influenza A by PCR NEGATIVE NEGATIVE Final   Influenza B by PCR NEGATIVE NEGATIVE Final  Comment: (NOTE) The Xpert Xpress SARS-CoV-2/FLU/RSV assay is intended as an aid in  the diagnosis of influenza from Nasopharyngeal swab specimens and  should not be used as a sole basis for treatment. Nasal washings and  aspirates are unacceptable for Xpert Xpress SARS-CoV-2/FLU/RSV  testing.  Fact Sheet for Patients: PinkCheek.be  Fact Sheet for Healthcare Providers: GravelBags.it  This test is not yet approved or cleared by the [place] FDA and  has been authorized for detection and/or diagnosis of SARS-CoV-2 by  FDA under an Emergency Use Authorization (EUA). This EUA will remain  in effect (meaning this test can be used) for the duration of the  Covid-19 declaration under Section 564(b)(1) of the Act, 21  U.S.C. section 360bbb-3(b)(1), unless the authorization is  terminated or revoked. Performed at [facility], [address] [place], [place] [address]     Coagulation Studies: Recent Labs    11/08/19 0531  LABPROT 11.8  INR 0.9    Imaging: ECHOCARDIOGRAM COMPLETE  Result Date: 11/08/2019    ECHOCARDIOGRAM REPORT   Patient Name:   Paula Gilmore [facility] Date of Exam: 11/08/2019 Medical Rec #:  [id number]             Height:       66.0 in Accession #:    [id number]            Weight:       400.0 lb Date of Birth:  04/22/1970            BSA:          2.685 m Patient Age:    77 years              BP:           132/86 mmHg Patient Gender: F                     HR:           81 bpm. Exam Location:  [facility] Procedure: 2D Echo, Cardiac Doppler and Color Doppler Indications:     Dyspnea 786.09  History:         Patient has prior history of Echocardiogram examinations, most                  recent 09/24/2016. Cardiomyopathy, COPD; Risk                  Factors:Hypertension. Mitral valve regurgitation.  Sonographer:     Sherrie Sport RDCS (AE) Referring Phys:  Theola Sequin Diagnosing Phys: Nelva Bush MD  IMPRESSIONS  1. Left ventricular ejection fraction, by estimation, is 60 to 65%. The left ventricle has normal function. The left ventricle has no regional wall motion abnormalities. There is moderate left ventricular hypertrophy. Left ventricular diastolic parameters were normal.  2. Right ventricular systolic function is normal. The right ventricular size is normal.  3. Right atrial size was mildly dilated.  4. The mitral valve is grossly normal. No evidence of mitral valve regurgitation.  5. The aortic valve has an indeterminant number of cusps. Aortic valve regurgitation is not visualized. No aortic stenosis is present. FINDINGS  Left Ventricle: Left ventricular ejection fraction, by estimation, is 60 to 65%. The left ventricle has normal function. The left ventricle has no regional wall motion abnormalities. The left ventricular internal cavity size was normal in size. There is  moderate left ventricular hypertrophy. Left ventricular diastolic parameters were normal. Right Ventricle: The right ventricular size  is normal. No increase in right ventricular wall thickness. Right ventricular systolic function is normal. Left Atrium: Left atrial size was normal in size. Right Atrium: Right atrial size was mildly dilated. Pericardium: The pericardium was not well visualized. Mitral Valve: The mitral valve is grossly normal. No evidence of mitral valve regurgitation. Tricuspid Valve: The tricuspid valve is not well visualized. Tricuspid valve regurgitation is trivial. Aortic Valve: The aortic valve has an indeterminant number of cusps. Aortic valve regurgitation is not visualized. No aortic stenosis is present. Aortic valve mean gradient measures 9.3 mmHg. Aortic valve peak gradient measures 17.4 mmHg. Aortic valve area, by VTI measures 1.98 cm. Pulmonic Valve: The pulmonic valve was not well visualized. Pulmonic valve regurgitation is not visualized. No evidence of pulmonic stenosis. Aorta: The aortic root is normal  in size and structure. Pulmonary Artery: The pulmonary artery is of normal size. Venous: The inferior vena cava was not well visualized. IAS/Shunts: The interatrial septum was not well visualized.  LEFT VENTRICLE PLAX 2D LVIDd:         4.79 cm  Diastology LVIDs:         2.97 cm  LV e' medial:    8.27 cm/s LV PW:         1.46 cm  LV E/e' medial:  11.4 LV IVS:        1.41 cm  LV e' lateral:   12.00 cm/s LVOT diam:     2.00 cm  LV E/e' lateral: 7.9 LV SV:         67 LV SV Index:   25 LVOT Area:     3.14 cm  RIGHT VENTRICLE RV Basal diam:  4.02 cm RV S prime:     17.80 cm/s TAPSE (M-mode): 3.8 cm LEFT ATRIUM             Index       RIGHT ATRIUM           Index LA diam:        3.80 cm 1.42 cm/m  RA Area:     20.10 cm LA Vol (A2C):   46.2 ml 17.21 ml/m RA Volume:   60.50 ml  22.53 ml/m LA Vol (A4C):   36.6 ml 13.63 ml/m LA Biplane Vol: 42.3 ml 15.75 ml/m  AORTIC VALVE                    PULMONIC VALVE AV Area (Vmax):    1.60 cm     PV Vmax:        0.99 m/s AV Area (Vmean):   1.57 cm     PV Peak grad:   4.0 mmHg AV Area (VTI):     1.98 cm     RVOT Peak grad: 4 mmHg AV Vmax:           208.33 cm/s AV Vmean:          140.333 cm/s AV VTI:            0.340 m AV Peak Grad:      17.4 mmHg AV Mean Grad:      9.3 mmHg LVOT Vmax:         106.00 cm/s LVOT Vmean:        70.000 cm/s LVOT VTI:          0.214 m LVOT/AV VTI ratio: 0.63  AORTA Ao Root diam: 2.80 cm MITRAL VALVE  TRICUSPID VALVE MV Area (PHT): 2.62 cm    TR Peak grad:   18.0 mmHg MV Decel Time: 289 msec    TR Vmax:        212.00 cm/s MV E velocity: 94.60 cm/s MV A velocity: 80.00 cm/s  SHUNTS MV E/A ratio:  1.18        Systemic VTI:  0.21 m                            Systemic Diam: 2.00 cm Paula Kendall MD Electronically signed by Paula Kendall MD Signature Date/Time: 11/08/2019/3:41:14 PM    Final    DG FL GUIDED LUMBAR PUNCTURE  Result Date: 11/08/2019 CLINICAL DATA:  Blurry vision, possible idiopathic intracranial hypertension, failed  bedside LP attempt EXAM: DIAGNOSTIC LUMBAR PUNCTURE UNDER FLUOROSCOPIC GUIDANCE FLUOROSCOPY TIME:  Fluoroscopy Time: 0 seconds PROCEDURE: Informed consent was obtained from the patient prior to the procedure, including potential complications of headache, allergy, and pain. With the patient prone, the lower back was prepped with Betadine. 1% Lidocaine was used for local anesthesia. Due to body habitus, radiographs were obtained rather than live fluoroscopy, which did not for sufficient visualization of anatomy. Lumbar puncture was attempted at the L2-L3 and L4-L5 levels using a 22 gauge 7 inch needle. The subarachnoid space could not be successfully accessed. The patient tolerated the procedure well and there were no apparent complications. IMPRESSION: Technically unsuccessful image guided lumbar puncture. Electronically Signed   By: Guadlupe Spanish M.D.   On: 11/08/2019 15:37    Medications:  I have reviewed the patient's current medications. Scheduled: . dicyclomine  10 mg Oral TID AC  . fluconazole  100 mg Oral Daily  . fluticasone  2 spray Each Nare Daily  . furosemide  20 mg Intravenous Daily  . insulin aspart  0-15 Units Subcutaneous TID WC  . insulin aspart  0-5 Units Subcutaneous QHS  . insulin aspart  10 Units Subcutaneous TID WC  . insulin glargine  40 Units Subcutaneous BID  . insulin starter kit- pen needles  1 kit Other Once  . living well with diabetes book   Does not apply Once  . loratadine  10 mg Oral Daily  . nystatin   Topical TID  . pantoprazole  40 mg Oral BID    Assessment/Plan: 49 y.o. obese female with a history of migraine, HTN, fibromyalgia and cardiomyopathy who presented in DKA but also complains of headache and blurry vision.  Patient reports that she has a history of migraine.  Current headaches are different.  MRI of the brain without contrast personally reviewed.  No acute changes noted.  There was mention of a partially empty sella and a concern for BIH was  raised.  Although it is likely that her current symptoms are likely related to her poorly controlled blood sugar, particularly when considering her normal discs, there is no way to truly rule out BIH without a spinal tap.  Patient is obese and spinal tap was unsuccessful at the bedside and with fluoro.  Patient would like to delay further attempts at this time.  Vision stable.  Blood sugars remain elevated.   ESR elevated at 63.  CRP pending  Recommendations: 1. Would continue to address blood sugars 2. Ophthalmology evaluation with LP to be reattempted if any evidence disc edema develops 3. CRP pending       LOS: 5 days   Thana Farr, MD Neurology  11/09/2019  10:03  AM    

## 2019-11-09 NOTE — Evaluation (Signed)
Physical Therapy Evaluation Patient Details Name: Paula Gilmore MRN: [id number] DOB: 03-Jan-1971 Today's Date: 11/09/2019   History of Present Illness  Paula Gilmore is a 49 y.o. female with medical history significant for hypertension, chronic osteoarthritic pain, morbid obesity who presented to the ED with a 3-day history of blurred vision, dry mouth, frequent urination and increased thirst that started after completing a Z-Pak and steroids for pneumonia. MD assessment includes diabetic ketoacidosis, AKI, and R ankle sprain.  Clinical Impression  Pt was pleasant and motivated to participate during the session. Pt reported that her R ankle felt okay after the sprain but that her mobility was limited by her bilateral knee pain. Pt was able to move from EOB to standing modified IND with RW and use of momentum to come to standing. Pt was able to ambulate 15' but needed to rest after secondary to knee pain and having limited endurance. Pt's SpO2 was 95% and HR was 102. Pt was able to transfer to [facility] mod IND. Pt was educated on stair sequencing and was able to perform min guard on 4 steps. Pt was significantly fatigued after and SpO2 was 96 and HR was 107. Pt reported having problems with endurance and walking distances at baseline. Pt will benefit from HHPT services upon discharge to safely address deficits listed in patient problem list for decreased caregiver assistance and eventual return to PLOF.      Follow Up Recommendations Home health PT    Equipment Recommendations  Rolling walker with 5" wheels    Recommendations for Other Services       Precautions / Restrictions Restrictions Weight Bearing Restrictions: No      Mobility  Bed Mobility               General bed mobility comments: not assessed. pt found in sitting  Transfers Overall transfer level: Modified independent Equipment used: Rolling walker (2 wheeled)             General transfer comment:  pt able to perform with momentum to come to standing  Ambulation/Gait Ambulation/Gait assistance: Modified independent (Device/Increase time) Gait Distance (Feet): 15 Feetx1, 7 feet x2, 3 feet x4 Assistive device: Rolling walker (2 wheeled) Gait Pattern/deviations: Wide base of support;Trunk flexed;Decreased stride length Gait velocity: decreased   General Gait Details: pt demonstrates a wide based gait with toe out pattern. Pt has significantly flexed posture. Pt has little endurance  Stairs Stairs: Yes Stairs assistance: Supervision Stair Management: Step to pattern;Two rails   General stair comments: pt significantly SOB after steps but has good Social worker    Modified Rankin (Stroke Patients Only)       Balance Overall balance assessment: Needs assistance   Sitting balance-Leahy Scale: Normal Sitting balance - Comments: pt able to maintain sitting balance with anticpatory and reactionary balance   Standing balance support: Bilateral upper extremity supported;During functional activity Standing balance-Leahy Scale: Good Standing balance comment: pt able to maintain static and synamic balance with bilateral UE support                             Pertinent Vitals/Pain Pain Assessment: 0-10 Pain Score: 2  Pain Location: Bilateral knees L>R Pain Descriptors / Indicators: Aching;Sore Pain Intervention(s): Limited activity within patient's tolerance;Monitored during session;Repositioned    Home Living Family/patient expects to be discharged to:: Private residence Living Arrangements: Children Available Help at Discharge: Family;Available PRN/intermittently Type of Home: [place]  Access: Stairs to enter Entrance Stairs-Rails: Can reach both;Right;Left Entrance Stairs-Number of Steps: 5 Home Layout: One level Home Equipment: Walker - 4 wheels;Cane - single point;Hand held shower head;Shower seat;Bedside commode (uses WCs for community  use)      Prior Function Level of Independence: Independent with assistive device(s)         Comments: able to complete ADLs. Has been been primarily homebound since [place]        Extremity/Trunk Assessment                Communication   Communication: No difficulties  Cognition Arousal/Alertness: Awake/alert Behavior During Therapy: WFL for tasks assessed/performed Overall Cognitive Status: Within Functional Limits for tasks assessed                                        General Comments General comments (skin integrity, edema, etc.): Significant edema noted in L knee    Exercises Other Exercises Other Exercises: knee extension with LE IR x10 Other Exercises: pt educated on use of mobility to improve fluid circulation   Assessment/Plan    PT Assessment Patient needs continued PT services  PT Problem List Decreased strength;Decreased mobility;Decreased range of motion;Decreased activity tolerance;Cardiopulmonary status limiting activity       PT Treatment Interventions DME instruction;Therapeutic exercise;Gait training;Balance training;Stair training;Functional mobility training;Therapeutic activities;Patient/family education    PT Goals (Current goals can be found in the Care Plan section)  Acute Rehab PT Goals Patient Stated Goal: to walk better PT Goal Formulation: With patient Time For Goal Achievement: 11/22/19 Potential to Achieve Goals: Good    Frequency Min 2X/week   Barriers to discharge        Co-evaluation               AM-PAC PT "6 Clicks" Mobility  Outcome Measure Help needed turning from your back to your side while in a flat bed without using bedrails?: A Little Help needed moving from lying on your back to sitting on the side of a flat bed without using bedrails?: A Little Help needed moving to and from a bed to a chair (including a wheelchair)?: None Help needed standing up from a chair  using your arms (e.g., wheelchair or bedside chair)?: None Help needed to walk in hospital room?: None Help needed climbing 3-5 steps with a railing? : A Little 6 Click Score: 21    End of Session Equipment Utilized During Treatment: Gait belt Activity Tolerance: Patient tolerated treatment well Patient left: in bed;with call bell/phone within reach Nurse Communication: Mobility status PT Visit Diagnosis: Unsteadiness on feet (R26.81);Difficulty in walking, not elsewhere classified (R26.2)    Time: 2458-0998 PT Time Calculation (min) (ACUTE ONLY): 61 min   Charges:              Hervey Ard, SPT 11/09/19. 10:50 AM

## 2019-11-09 NOTE — Progress Notes (Signed)
PROGRESS NOTE    Paula Gilmore  PNT:[id number] DOB: Sep 11, 1970 DOA: 11/04/2019 PCP: Lawernce Pitts, FNP    Chief Complaint  Patient presents with  . Hyperglycemia    Subjective:  The patient was seen and examined this morning remained stable no acute distress. Reporting that attempt was made on the fluoroscopy for LP was unsuccessful. Informed for diabetic condition, wanting to comply with diet and medication recommended.   Brief Narrative:  49 year old lady with prior hypertension, MORBID OBESITY,fibromyalgia, presents to ED with blurred vision, frequent urination, increased thirst, and on for a few weeks was found to be in DKA on arrival to ED.  She was admitted for evaluation and management of DKA and new onset DM. Though her gap was closed and bicarb improved her cbgs have been running very high. She is currently requiring upto 40 units of Insulin BID to keep her cbgs around 200. During her hospitalization, pt had some blurry vision, intermittent headaches , . MRI of the head without contrast obtained , did reveal empty sella and possible intra cranial hypertension. Neurology consulted to see if can check opening pressure at LP. Bed side LP was unsuccessful, she is scheduled for fluoro guided LP later today.   Patient can possibly discharged tomorrow.   Assessment & Plan:   Principal Problem:   DKA (diabetic ketoacidosis) ([place]) Active Problems:   Sleep apnea   Morbid obesity with BMI of 60.0-69.9, adult (HCC)   Chronic pain   HTN (hypertension)   UTI (urinary tract infection)   New onset type 2 diabetes mellitus ([place])    Diabetic ketoacidosis -Resolved Resolved. She was started on lantus and increased the dose to  40 units BID, added Novolog to 10 units [facility] and continue with resistant scale SSI.  CBG (last 3)  Recent Labs    11/08/19 2101 11/09/19 0746 11/09/19 1151  GLUCAP 209* 232* 256*   Anion gap is closed and  bicarb within normal  limits. Diabetes education/coordinator consulted and recommendations given. -To optimize compliance will consider NPH 70/30 NovoLog   Essential hypertension BP parameters are well controlled.     Frequent urination and abnormal UA, -Status post treatment for presumed UTI Urine cultures show multiple species.  Changed to oral keflex to complete the course.   Morbid obesity Recommend referral to bariatric surgery for weight loss.. Body mass index is 64.56 kg/m. Dietary consulted.    Blurry vision and dizziness for a few weeks now/ persistent daily headaches/ with a h/o Migraines. -No change in visual disturbances, improved dizziness, -Status post bedside and under fluoroscopy LP was not successful  No falls or loss of consciousness MRI of the brain without contrast ordered for further evaluation. Its shows empty sella, possible intracranial hypertension.  Neurology consulted to see if we can do LP and check opening pressure. Bed side LP unsuccessful,  Was scheduled for Fluoro guided LP later on 11/08/2019-unsuccessful -Resuming aspirin and Lovenox Recommend outpatient follow up with ophthalmology.    Right ankle sprain X-rays of the ankle negative for fracture show some soft tissue swelling. Pain control .     Hypokalemia Was replaced, stable    Hyponatremia probably from hyperglycemia. Improved   COPD:  Some dyspnea, pt on lasix at home, -BNP 43.9, Echo: Moderate LVH, ejection fraction 60-65%,   Leukocytosis probably from UTI/reactive. Resolved status post antibiotic treatment.    Itching in the groin area:  Candidiasis of the groin area.  Started her on diflucan.  Nystatin powder.  GERD;  Stable, started her on protonix.     DVT prophylaxis: (Lovenox) Code Status: (Full code.  Family Communication: none at bedside.  Disposition:   Status is: Inpatient  Remains inpatient appropriate because:Ongoing diagnostic testing needed not  appropriate for outpatient work up, IV treatments appropriate due to intensity of illness or inability to take PO and Inpatient level of care appropriate due to severity of illness   Dispo: The patient is from: Home              Anticipated d/c is to: Home              Anticipated d/c date is: 1 day              Patient currently is not medically stable to d/c.       Consultants:   Neurology    Procedures: MRI brain without contrast.   Antimicrobials: none.     Objective: Vitals:   11/08/19 1705 11/08/19 2008 11/09/19 0600 11/09/19 1150  BP: 114/63 135/85 (!) 141/91 133/79  Pulse: 81 81 84 93  Resp: '20 18 20 20  ' Temp: 97.8 F (36.6 C) 98.3 F (36.8 C) 99 F (37.2 C) 98.5 F (36.9 C)  TempSrc: Oral   Oral  SpO2: 100% 99% 98% 94%  Weight:      Height:        Intake/Output Summary (Last 24 hours) at 11/09/2019 1308 Last data filed at 11/09/2019 1009 Gross per 24 hour  Intake 240 ml  Output 3250 ml  Net -3010 ml   Filed Weights   11/03/19 1955  Weight: (!) 181.4 kg      Physical Exam:   General:  Alert, oriented, cooperative, no distress;   HEENT:  Normocephalic, PERRL, otherwise with in Normal limits subjective blurry vision no changes  Neuro:  CNII-XII intact. , normal motor and sensation, reflexes intact   Lungs:   Clear to auscultation BL, Respirations unlabored, no wheezes / crackles  Cardio:    S1/S2, RRR, No murmure, No Rubs or Gallops   Abdomen:   Soft, non-tender, bowel sounds active all four quadrants,  no guarding or peritoneal signs.  Muscular skeletal:  Limited exam - in bed, able to move all 4 extremities, Normal strength,  2+ pulses,  symmetric, No pitting edema  Skin:  Dry, warm to touch, negative for any Rashes, No open wounds  Wounds: Please see nursing documentation         Data Reviewed: I have personally reviewed following labs and imaging studies  CBC: Recent Labs  Lab 11/03/19 2003 11/04/19 0136 11/07/19 0917  WBC 11.9*  12.1* 9.2  HGB 14.4 13.8 11.5*  HCT 41.0 39.4 34.7*  MCV 85.8 86.0 89.4  PLT 298 284 283    Basic Metabolic Panel: Recent Labs  Lab 11/04/19 2218 11/04/19 2218 11/05/19 0732 11/05/19 1410 11/05/19 2100 11/06/19 0407 11/07/19 0917  NA 132*   < > 133* UNABLE TO REPORT DUE TO LIPEMIC INTERFERENCE 133* 131* 131*  K 3.5   < > 3.4* 3.8 3.8 3.6 3.9  CL 93*   < > 93* 96* 96* 94* 99  CO2 23   < > 26 18* '23 23 23  ' GLUCOSE 187*   < > 215* 265* 243* 337* 332*  BUN 14   < > '11 11 8 8 7  ' CREATININE 0.78   < > 0.75 0.74 0.77 0.76 0.63  CALCIUM 9.4   < > 9.1 8.8* 8.9 8.7* 8.5*  MG 2.0  --   --   --   --   --   --    < > = values in this interval not displayed.    GFR: Estimated Creatinine Clearance: 145.2 mL/min (by C-G formula based on SCr of 0.63 mg/dL).  Liver Function Tests: Recent Labs  Lab 11/07/19 0917  AST 42*  ALT 32  ALKPHOS 67  BILITOT 0.9  PROT 6.4*  ALBUMIN 3.3*    CBG: Recent Labs  Lab 11/08/19 1145 11/08/19 1703 11/08/19 2101 11/09/19 0746 11/09/19 1151  GLUCAP 294* 193* 209* 232* 256*     Recent Results (from the past 240 hour(s))  Urine culture     Status: Abnormal   Collection Time: 11/03/19  8:03 PM   Specimen: Urine, Random  Result Value Ref Range Status   Specimen Description   Final    URINE, RANDOM Performed at [facility], [address], [place], [place] [address]    Special Requests   Final    NONE Performed at [facility], [address] [place], [place] [address]    Culture MULTIPLE SPECIES PRESENT, SUGGEST RECOLLECTION (A)  Final   Report Status 11/06/2019 FINAL  Final  Respiratory Panel by RT PCR (Flu A&B, Covid) - Nasopharyngeal Swab     Status: None   Collection Time: 11/04/19 12:44 AM   Specimen: Nasopharyngeal Swab  Result Value Ref Range Status   SARS Coronavirus 2 by RT PCR NEGATIVE NEGATIVE Final    Comment: (NOTE) SARS-CoV-2 target nucleic acids are NOT DETECTED.  The SARS-CoV-2 RNA is  generally detectable in upper respiratoy specimens during the acute phase of infection. The lowest concentration of SARS-CoV-2 viral copies this assay can detect is 131 copies/mL. A negative result does not preclude SARS-Cov-2 infection and should not be used as the sole basis for treatment or other patient management decisions. A negative result may occur with  improper specimen collection/handling, submission of specimen other than nasopharyngeal swab, presence of viral mutation(s) within the areas targeted by this assay, and inadequate number of viral copies (<131 copies/mL). A negative result must be combined with clinical observations, patient history, and epidemiological information. The expected result is Negative.  Fact Sheet for Patients:  PinkCheek.be  Fact Sheet for Healthcare Providers:  GravelBags.it  This test is no t yet approved or cleared by the [place] FDA and  has been authorized for detection and/or diagnosis of SARS-CoV-2 by FDA under an Emergency Use Authorization (EUA). This EUA will remain  in effect (meaning this test can be used) for the duration of the COVID-19 declaration under Section 564(b)(1) of the Act, 21 U.S.C. section 360bbb-3(b)(1), unless the authorization is terminated or revoked sooner.     Influenza A by PCR NEGATIVE NEGATIVE Final   Influenza B by PCR NEGATIVE NEGATIVE Final    Comment: (NOTE) The Xpert Xpress SARS-CoV-2/FLU/RSV assay is intended as an aid in  the diagnosis of influenza from Nasopharyngeal swab specimens and  should not be used as a sole basis for treatment. Nasal washings and  aspirates are unacceptable for Xpert Xpress SARS-CoV-2/FLU/RSV  testing.  Fact Sheet for Patients: PinkCheek.be  Fact Sheet for Healthcare Providers: GravelBags.it  This test is not yet approved or cleared by the [place] FDA and  has been authorized for detection and/or diagnosis of SARS-CoV-2 by  FDA under an Emergency Use Authorization (EUA). This EUA will remain  in effect (meaning this test can be used) for the duration  of the  Covid-19 declaration under Section 564(b)(1) of the Act, 21  U.S.C. section 360bbb-3(b)(1), unless the authorization is  terminated or revoked. Performed at [facility], [address], [place], [place] [address]          Radiology Studies: ECHOCARDIOGRAM COMPLETE  Result Date: 11/08/2019    ECHOCARDIOGRAM REPORT   Patient Name:   Paula Gilmore [facility] Date of Exam: 11/08/2019 Medical Rec #:  [id number]             Height:       66.0 in Accession #:    [id number]            Weight:       400.0 lb Date of Birth:  1970-06-07            BSA:          2.685 m Patient Age:    47 years              BP:           132/86 mmHg Patient Gender: F                     HR:           81 bpm. Exam Location:  [facility] Procedure: 2D Echo, Cardiac Doppler and Color Doppler Indications:     Dyspnea 786.09  History:         Patient has prior history of Echocardiogram examinations, most                  recent 09/24/2016. Cardiomyopathy, COPD; Risk                  Factors:Hypertension. Mitral valve regurgitation.  Sonographer:     Sherrie Sport RDCS (AE) Referring Phys:  Theola Sequin Diagnosing Phys: Nelva Bush MD IMPRESSIONS  1. Left ventricular ejection fraction, by estimation, is 60 to 65%. The left ventricle has normal function. The left ventricle has no regional wall motion abnormalities. There is moderate left ventricular hypertrophy. Left ventricular diastolic parameters were normal.  2. Right ventricular systolic function is normal. The right ventricular size is normal.  3. Right atrial size was mildly dilated.  4. The mitral valve is grossly normal. No evidence of mitral valve regurgitation.  5. The aortic valve has an indeterminant number of cusps. Aortic valve regurgitation  is not visualized. No aortic stenosis is present. FINDINGS  Left Ventricle: Left ventricular ejection fraction, by estimation, is 60 to 65%. The left ventricle has normal function. The left ventricle has no regional wall motion abnormalities. The left ventricular internal cavity size was normal in size. There is  moderate left ventricular hypertrophy. Left ventricular diastolic parameters were normal. Right Ventricle: The right ventricular size is normal. No increase in right ventricular wall thickness. Right ventricular systolic function is normal. Left Atrium: Left atrial size was normal in size. Right Atrium: Right atrial size was mildly dilated. Pericardium: The pericardium was not well visualized. Mitral Valve: The mitral valve is grossly normal. No evidence of mitral valve regurgitation. Tricuspid Valve: The tricuspid valve is not well visualized. Tricuspid valve regurgitation is trivial. Aortic Valve: The aortic valve has an indeterminant number of cusps. Aortic valve regurgitation is not visualized. No aortic stenosis is present. Aortic valve mean gradient measures 9.3 mmHg. Aortic valve peak gradient measures 17.4 mmHg. Aortic valve area, by VTI measures 1.98 cm. Pulmonic Valve: The pulmonic valve was not well visualized. Pulmonic valve  regurgitation is not visualized. No evidence of pulmonic stenosis. Aorta: The aortic root is normal in size and structure. Pulmonary Artery: The pulmonary artery is of normal size. Venous: The inferior vena cava was not well visualized. IAS/Shunts: The interatrial septum was not well visualized.  LEFT VENTRICLE PLAX 2D LVIDd:         4.79 cm  Diastology LVIDs:         2.97 cm  LV e' medial:    8.27 cm/s LV PW:         1.46 cm  LV E/e' medial:  11.4 LV IVS:        1.41 cm  LV e' lateral:   12.00 cm/s LVOT diam:     2.00 cm  LV E/e' lateral: 7.9 LV SV:         67 LV SV Index:   25 LVOT Area:     3.14 cm  RIGHT VENTRICLE RV Basal diam:  4.02 cm RV S prime:     17.80 cm/s  TAPSE (M-mode): 3.8 cm LEFT ATRIUM             Index       RIGHT ATRIUM           Index LA diam:        3.80 cm 1.42 cm/m  RA Area:     20.10 cm LA Vol (A2C):   46.2 ml 17.21 ml/m RA Volume:   60.50 ml  22.53 ml/m LA Vol (A4C):   36.6 ml 13.63 ml/m LA Biplane Vol: 42.3 ml 15.75 ml/m  AORTIC VALVE                    PULMONIC VALVE AV Area (Vmax):    1.60 cm     PV Vmax:        0.99 m/s AV Area (Vmean):   1.57 cm     PV Peak grad:   4.0 mmHg AV Area (VTI):     1.98 cm     RVOT Peak grad: 4 mmHg AV Vmax:           208.33 cm/s AV Vmean:          140.333 cm/s AV VTI:            0.340 m AV Peak Grad:      17.4 mmHg AV Mean Grad:      9.3 mmHg LVOT Vmax:         106.00 cm/s LVOT Vmean:        70.000 cm/s LVOT VTI:          0.214 m LVOT/AV VTI ratio: 0.63  AORTA Ao Root diam: 2.80 cm MITRAL VALVE               TRICUSPID VALVE MV Area (PHT): 2.62 cm    TR Peak grad:   18.0 mmHg MV Decel Time: 289 msec    TR Vmax:        212.00 cm/s MV E velocity: 94.60 cm/s MV A velocity: 80.00 cm/s  SHUNTS MV E/A ratio:  1.18        Systemic VTI:  0.21 m                            Systemic Diam: 2.00 cm Nelva Bush MD Electronically signed by Nelva Bush MD Signature Date/Time: 11/08/2019/3:41:14 PM    Final    DG FL GUIDED LUMBAR PUNCTURE  Result Date: 11/08/2019 CLINICAL DATA:  Blurry vision, possible idiopathic intracranial hypertension, failed bedside LP attempt EXAM: DIAGNOSTIC LUMBAR PUNCTURE UNDER FLUOROSCOPIC GUIDANCE FLUOROSCOPY TIME:  Fluoroscopy Time: 0 seconds PROCEDURE: Informed consent was obtained from the patient prior to the procedure, including potential complications of headache, allergy, and pain. With the patient prone, the lower back was prepped with Betadine. 1% Lidocaine was used for local anesthesia. Due to body habitus, radiographs were obtained rather than live fluoroscopy, which did not for sufficient visualization of anatomy. Lumbar puncture was attempted at the L2-L3 and L4-L5 levels  using a 22 gauge 7 inch needle. The subarachnoid space could not be successfully accessed. The patient tolerated the procedure well and there were no apparent complications. IMPRESSION: Technically unsuccessful image guided lumbar puncture. Electronically Signed   By: Macy Mis M.D.   On: 11/08/2019 15:37        Scheduled Meds: . dicyclomine  10 mg Oral TID AC  . fluconazole  100 mg Oral Daily  . fluticasone  2 spray Each Nare Daily  . furosemide  20 mg Intravenous Daily  . insulin aspart  0-15 Units Subcutaneous TID WC  . insulin aspart  0-5 Units Subcutaneous QHS  . insulin aspart  10 Units Subcutaneous TID WC  . insulin glargine  40 Units Subcutaneous BID  . insulin starter kit- pen needles  1 kit Other Once  . living well with diabetes book   Does not apply Once  . loratadine  10 mg Oral Daily  . nystatin   Topical TID  . pantoprazole  40 mg Oral BID   Continuous Infusions:    LOS: 5 days    Deatra James, MD [facility]   To contact the attending provider between 7A-7P or the covering provider during after hours 7P-7A, please log into the web site [email or web address] and access using universal [place] password for that web site. If you do not have the password, please call the hospital operator.  11/09/2019, 1:08 PM

## 2019-11-09 NOTE — TOC Initial Note (Signed)
Transition of Care [facility]) - Initial/Assessment Note    Patient Details  Name: Paula Gilmore MRN: [id number] Date of Birth: 09-30-1970  [facility] CM/SW Contact:    Beverly Sessions, RN Phone Number: 11/09/2019, 2:21 PM  Clinical Narrative:                 Patient admitted from home with DKA.  New dx of DM.  Diabetes coordinator involved. Bedside nursing to provide education.  Patient provided Nutrition and Diabetes Education Services at [place] delivered to room by [employer] [facility]. Discussed home health RN and PT.  She declines services at this time.  MD notified  Patient states that she live at home alone. Mom and brother live next door.  Brother provides transportation, and also uses medicaid transport.    Established PCP, and denies issues obtaining medications     Expected Discharge Plan: Home/Self Care Barriers to Discharge: Continued Medical Work up   Patient Goals and CMS Choice        Expected Discharge Plan and Services Expected Discharge Plan: Home/Self Care   Discharge Planning Services: CM Consult   Living arrangements for the past 2 months: Single Family Home Expected Discharge Date: 11/08/19               DME Arranged: [facility] Rile rolling   Date DME Agency Contacted: 11/09/19   Representative spoke with at DME Agency: [place]: Patient Refused [facility]          Prior Living Arrangements/Services Living arrangements for the past 2 months: [place] Lives with:: Self                   Activities of Daily Living Home Assistive Devices/Equipment: Cane (specify quad or straight), Walker (specify type) ADL Screening (condition at time of admission) Patient's cognitive ability adequate to safely complete daily activities?: Yes Is the patient deaf or have difficulty hearing?: Yes Does the patient have difficulty seeing, even when wearing glasses/contacts?: Yes Does the patient have  difficulty concentrating, remembering, or making decisions?: No Patient able to express need for assistance with ADLs?: Yes Does the patient have difficulty dressing or bathing?: No Independently performs ADLs?: Yes (appropriate for developmental age) Does the patient have difficulty walking or climbing stairs?: Yes Weakness of Legs: None Weakness of Arms/Hands: None  Permission Sought/Granted                  Emotional Assessment       Orientation: : Oriented to Self, Oriented to Place, Oriented to  Time, Oriented to Situation   Psych Involvement: No (comment)  Admission diagnosis:  Hyponatremia [E87.1] DKA (diabetic ketoacidosis) ([place]) [E11.10] Hyperglycemia [R73.9] Urinary tract infection without hematuria, site unspecified [N39.0] Pain and swelling of right ankle [M25.571, M25.471] Diabetic ketoacidosis without coma associated with drug or chemical induced diabetes mellitus ([place]) [E09.10] Patient Active Problem List   Diagnosis Date Noted  . Chronic pain 11/04/2019  . HTN (hypertension) 11/04/2019  . UTI (urinary tract infection) 11/04/2019  . DKA (diabetic ketoacidosis) ([place]) 11/04/2019  . New onset type 2 diabetes mellitus ([place]) 11/04/2019  . Morbid obesity with BMI of 60.0-69.9, adult ([place]) 07/19/2017  . Headache (Primary Area of Pain) 01/14/2017  . Chronic low back pain (Secondary Area of Pain) (B) (R>L) 01/14/2017  . Chronic pain of lower extremity [facility] Area of Pain) (Bilateral) (R>L) 01/14/2017  . Bilateral chronic knee pain (Fourth Area of Pain) (Bilateral) (R>L) 01/14/2017  .  Chronic upper extremity pain 01/14/2017  . Chronic shoulder pain (B) (R>L) 01/14/2017  . Chronic neck pain (midline) 01/14/2017  . Disorder of bone, unspecified 01/14/2017  . Other long term (current) drug therapy 01/14/2017  . Other specified health status 01/14/2017  . Chronic pain syndrome 01/14/2017  . Chest pain with high risk for cardiac etiology 09/15/2016  . Mitral valve  regurgitation 09/15/2016  . Shortness of breath 09/15/2016  . Sleep apnea 09/15/2016   PCP:  Lawernce Pitts, [place] Pharmacy:   [facility] [address], [place] - [place] AT [facility] [address] [place] [address] Phone: [phone] Fax: [phone]  [place] [address]), [place] - [place] [address] Phone: [phone] Fax: [place] #[id number] [place], [place] - [place] AT [facility] [address] [place] [address] Phone: [phone] Fax: [place], [place] - [place] [address] Phone: [phone] Fax: [phone]     Social Determinants of Health (SDOH) Interventions    Readmission Risk Interventions No flowsheet data found.

## 2019-11-10 DIAGNOSIS — N3001 Acute cystitis with hematuria: Secondary | ICD-10-CM | POA: Diagnosis not present

## 2019-11-10 DIAGNOSIS — G894 Chronic pain syndrome: Secondary | ICD-10-CM

## 2019-11-10 DIAGNOSIS — H538 Other visual disturbances: Secondary | ICD-10-CM | POA: Diagnosis not present

## 2019-11-10 DIAGNOSIS — E1110 Type 2 diabetes mellitus with ketoacidosis without coma: Secondary | ICD-10-CM | POA: Diagnosis not present

## 2019-11-10 DIAGNOSIS — M25471 Effusion, right ankle: Secondary | ICD-10-CM

## 2019-11-10 DIAGNOSIS — R739 Hyperglycemia, unspecified: Secondary | ICD-10-CM

## 2019-11-10 DIAGNOSIS — E119 Type 2 diabetes mellitus without complications: Secondary | ICD-10-CM

## 2019-11-10 DIAGNOSIS — R519 Headache, unspecified: Secondary | ICD-10-CM

## 2019-11-10 DIAGNOSIS — M25571 Pain in right ankle and joints of right foot: Secondary | ICD-10-CM

## 2019-11-10 LAB — GLUCOSE, CAPILLARY
Glucose-Capillary: 218 mg/dL — ABNORMAL HIGH (ref 70–99)
Glucose-Capillary: 304 mg/dL — ABNORMAL HIGH (ref 70–99)
Glucose-Capillary: 222 mg/dL — ABNORMAL HIGH (ref 70–99)

## 2019-11-10 LAB — C-REACTIVE PROTEIN

## 2019-11-10 MED ORDER — INSULIN ASPART PROT & ASPART (70-30 MIX) 100 UNIT/ML ~~LOC~~ SUSP
55.0000 [IU] | Freq: Once | SUBCUTANEOUS | Status: AC
  Administered 2019-11-10: 55 [IU] via SUBCUTANEOUS
  Filled 2019-11-10: qty 10

## 2019-11-10 MED ORDER — FUROSEMIDE 20 MG PO TABS
20.0000 mg | ORAL_TABLET | Freq: Every day | ORAL | Status: DC
  Administered 2019-11-10: 20 mg via ORAL
  Filled 2019-11-10: qty 1

## 2019-11-10 MED ORDER — BLOOD GLUCOSE MONITOR KIT
PACK | 1 refills | Status: DC
Start: 2019-11-10 — End: 2019-11-10

## 2019-11-10 MED ORDER — INSULIN ASPART PROT & ASPART (70-30 MIX) 100 UNIT/ML ~~LOC~~ SUSP: 55.0000 [IU] | Freq: Two times a day (BID) | SUBCUTANEOUS | Status: DC

## 2019-11-10 MED ORDER — INSULIN ASPART PROT & ASPART (70-30 MIX) 100 UNIT/ML ~~LOC~~ SUSP: 60.0000 [IU] | Freq: Two times a day (BID) | SUBCUTANEOUS | 11 refills | Status: DC

## 2019-11-10 MED ORDER — PEN NEEDLES 30G X 5 MM MISC: 10 refills | Status: AC

## 2019-11-10 MED ORDER — INSULIN ASPART PROT & ASPART (70-30 MIX) 100 UNIT/ML ~~LOC~~ SUSP: 45.0000 [IU] | Freq: Two times a day (BID) | SUBCUTANEOUS | Status: DC

## 2019-11-10 MED ORDER — NOVOLOG FLEXPEN 100 UNIT/ML ~~LOC~~ SOPN: PEN_INJECTOR | SUBCUTANEOUS | 11 refills | Status: AC

## 2019-11-10 MED ORDER — BLOOD GLUCOSE MONITOR KIT: PACK | 1 refills | Status: AC

## 2019-11-10 MED ORDER — PEN NEEDLES 30G X 5 MM MISC
10 refills | Status: DC
Start: 2019-11-10 — End: 2019-11-10

## 2019-11-10 NOTE — Progress Notes (Signed)
Diabetic education provided. Pt able to administer subcu insulin injection by herself without any prompting. Pt also educated on insulin pen use. Verbalized understanding.

## 2019-11-10 NOTE — Discharge Summary (Signed)
Physician Discharge Summary [facility]    Patient: Paula Gilmore                   Admit date: 11/04/2019   DOB: Oct 12, 1970             Discharge date:11/10/2019/1:32 PM TML:[id number]                          PCP: Paula Pitts, FNP  Disposition: HOME with [facility]   Recommendations for Outpatient Follow-up:   . Follow up: in 1 week  Discharge Condition: Stable   Code Status:   Code Status: Full Code  Diet recommendation: Diabetic diet   Discharge Diagnoses:    Principal Problem:   DKA (diabetic ketoacidosis) ([place]) Active Problems:   Sleep apnea   Morbid obesity with BMI of 60.0-69.9, adult (HCC)   Chronic pain   HTN (hypertension)   UTI (urinary tract infection)   New onset type 2 diabetes mellitus ([place])   History of Present Illness/ Hospital Course Paula Gilmore Summary:   Brief Narrative:  49 year old lady with prior hypertension, MORBID OBESITY,fibromyalgia, presents to ED with blurred vision, frequent urination, increased thirst, and on for a few weeks was found to be in DKA on arrival to ED.  She was admitted for evaluation and management of DKA and new onset DM. Though her gap was closed and bicarb improved her cbgs have been running very high. She is currently requiring upto 40 units of Insulin BID to keep her cbgs around 200. During her hospitalization, pt had some blurry vision, intermittent headaches , . MRI of the head without contrast obtained , did reveal empty sella and possible intra cranial hypertension. Neurology consulted to see if can check opening pressure at LP. Bed side LP was unsuccessful, she is scheduled for fluoro guided LP later today.   Patient can possibly discharged tomorrow.    Diabetic ketoacidosis -Resolved Resolved. She was started on lantus and increased the dose to  40 units BID, added Novolog to 10 units [facility] and continue with resistant scale SSI.  CBG (last 3)  Recent Labs (last 2 labs)        Recent Labs     11/08/19 2101 11/09/19 0746 11/09/19 1151  GLUCAP 209* 232* 256*     Anion gap is closed and  bicarb within normal limits. Diabetes education/coordinator consulted and recommendations given. -To optimize compliance switched over to NPH 70/30 NovoLog -He is instructed to keep a log of her blood sugars, as total units of NPH will change   Essential hypertension BP parameters are well controlled.     Frequent urination and abnormal UA, -Status post treatment for presumed UTI Urine cultures show multiple species.  Changed to oral keflex to complete the course.   Morbid obesity Recommend referral to bariatric surgery for weight loss.. Body mass index is 64.56 kg/m. Dietary consulted.    Blurry vision and dizziness for a few weeks now/ persistent daily headaches/ with a h/o Migraines. -No change in visual disturbances, improved dizziness, -Status post bedside and under fluoroscopy LP was not successful  No falls or loss of consciousness MRI of the brain without contrast ordered for further evaluation. Its shows empty sella, possible intracranial hypertension.  Neurology consulted to see if we can do LP and check opening pressure. Bed side LP unsuccessful,  Was scheduled for Fluoro guided LP later on 11/08/2019-unsuccessful -Resuming aspirin and Lovenox Recommend outpatient follow up with  ophthalmology.    Right ankle sprain X-rays of the ankle negative for fracture show some soft tissue swelling. Pain control .     Hypokalemia Was replaced, stable    Hyponatremia probably from hyperglycemia. Improved   COPD:  Some dyspnea, pt on lasix at home, -BNP 43.9, Echo: Moderate LVH, ejection fraction 60-65%,   Leukocytosis probably from UTI/reactive. Resolved status post antibiotic treatment.    Itching in the groin area:  Candidiasis of the groin area.  Started her on diflucan.  Nystatin powder.    GERD;  Stable, started her on  protonix.    Code Status: (Full code.  Family Communication: none at bedside.  Disposition:    Dispo: The patient is from: Home  Anticipated d/c is to: Home     Consultants:   Neurology /neurosurgery   Procedures: MRI brain without contrast.   Antimicrobials: none    Discharge Instructions:   Discharge Instructions    Activity as tolerated - No restrictions   Complete by: As directed    Amb Referral to Nutrition and Diabetic E   Complete by: As directed    Ambulatory referral to Ophthalmology   Complete by: As directed    Call MD for:  difficulty breathing, headache or visual disturbances   Complete by: As directed    Call MD for:  persistant nausea and vomiting   Complete by: As directed    Call MD for:  redness, tenderness, or signs of infection (pain, swelling, redness, odor or green/yellow discharge around incision site)   Complete by: As directed    Diet - low sodium heart healthy   Complete by: As directed    Diet Carb Modified   Complete by: As directed    Discharge instructions   Complete by: As directed    Please follow up with PCP in one week.   Discharge instructions   Complete by: As directed    F/up with PCP .....   Increase activity slowly   Complete by: As directed        Medication List    STOP taking these medications   ascorbic acid 500 MG tablet Commonly known as: VITAMIN C   Calcium-Magnesium-Zinc Tabs   hydrochlorothiazide 25 MG tablet Commonly known as: HYDRODIURIL   methocarbamol 750 MG tablet Commonly known as: ROBAXIN     TAKE these medications   acetaminophen 500 MG tablet Commonly known as: TYLENOL Take 1,000 mg by mouth every 6 (six) hours as needed.   albuterol (2.5 MG/3ML) 0.083% nebulizer solution Commonly known as: PROVENTIL Take 2.5 mg by nebulization every 6 (six) hours as needed for wheezing or shortness of breath.   aspirin EC 81 MG tablet Take 81 mg by mouth daily. Swallow whole.     blood glucose meter kit and supplies Kit Dispense based on patient and insurance preference. Use up to four times daily as directed. (FOR ICD-9 250.00, 250.01).   calcium carbonate 500 MG chewable tablet Commonly known as: TUMS - dosed in mg elemental calcium Chew 1 tablet (200 mg of elemental calcium total) by mouth 4 (four) times daily as needed for indigestion or heartburn.   cetirizine 10 MG tablet Commonly known as: ZYRTEC Take 10 mg by mouth 2 (two) times daily.   cholecalciferol 25 MCG (1000 UNIT) tablet Commonly known as: VITAMIN D3 Take 2,000 Units by mouth daily.   dicyclomine 10 MG capsule Commonly known as: BENTYL Take 10 mg by mouth 4 (four) times daily as needed for spasms.  fluticasone 50 MCG/ACT nasal spray Commonly known as: FLONASE Place 2 sprays into both nostrils daily.   furosemide 20 MG tablet Commonly known as: LASIX Take 20 mg by mouth daily.   insulin aspart protamine- aspart (70-30) 100 UNIT/ML injection Commonly known as: NOVOLOG MIX 70/30 Inject 0.6 mLs (60 Units total) into the skin 2 (two) times daily with a meal.   ipratropium-albuterol 0.5-2.5 (3) MG/3ML Soln Commonly known as: DUONEB Take 3 mLs by nebulization every 6 (six) hours as needed.   lisinopril 5 MG tablet Commonly known as: ZESTRIL Take 5 mg by mouth daily.   loperamide 2 MG capsule Commonly known as: IMODIUM Take 2 mg by mouth as needed.   multivitamin with minerals Tabs tablet Take 1 tablet by mouth daily.   NovoLOG FlexPen 100 UNIT/ML FlexPen Generic drug: insulin aspart CBG 70 - 120: 0 units CBG 121 - 150: 2 units CBG 151 - 200: 3 units CBG 201 - 250: 5 units CBG 251 - 300: 8 units CBG 301 - 350: 11 units CBG 351 - 400: 15 units   omeprazole 20 MG capsule Commonly known as: PRILOSEC Take 2 capsules (40 mg total) by mouth daily. What changed: how much to take   ondansetron 4 MG disintegrating tablet Commonly known as: Zofran ODT Take 1 tablet (4 mg total)  by mouth every 6 (six) hours as needed for nausea or vomiting.   Pen Needles 30G X 5 MM Misc To Use Three times a day.   SUCRALFATE PO Take 1 tablet by mouth 4 (four) times daily.     ASK your doctor about these medications   insulin starter kit- pen needles Misc 1 kit by Other route once for 1 dose. Ask about: Should I take this medication?       Follow-up Information    Paula Pitts, FNP. Schedule an appointment as soon as possible for a visit in 1 week.   Specialty: Family Medicine Why: PLease call to schedule your follow up. Contact information: [place] [address] [phone]              Allergies  Allergen Reactions  . Cymbalta [Duloxetine Hcl] Other (See Comments)    V-tach  . Montelukast Hives  . Tizanidine Hives  . Savella [Milnacipran Hcl] Palpitations     Procedures /Studies:   DG Ankle 2 Views Right  Result Date: 11/04/2019 CLINICAL DATA:  Pain EXAM: RIGHT ANKLE - 2 VIEW COMPARISON:  None. FINDINGS: There is soft tissue swelling about the ankle. There is no acute displaced fracture or dislocation. Mild degenerative changes are noted of the talonavicular joint. IMPRESSION: Soft tissue swelling about the ankle without evidence for acute displaced fracture or dislocation. Electronically Signed   By: Constance Holster M.D.   On: 11/04/2019 22:42   MR BRAIN WO CONTRAST  Result Date: 11/07/2019 CLINICAL DATA:  Dizziness, persistence/recurrence.  Blurry vision. EXAM: MRI HEAD WITHOUT CONTRAST TECHNIQUE: Multiplanar, multiecho pulse sequences of the brain and surrounding structures were obtained without intravenous contrast. COMPARISON:  None. FINDINGS: Brain: No acute infarction, hemorrhage, hydrocephalus, extra-axial collection or mass lesion. Dilated perivascular space in the inferior left basal ganglia. Mild scattered T2/FLAIR hyperintensities within the subcortical and periventricular white matter, most likely secondary to  chronic microvascular ischemic disease and not particularly advanced for age. Partially empty sella. Vascular: Major proximal arterial flow voids are maintained at the skull base. Skull and upper cervical spine: Diffuse T1 hypointensity of the marrow. Sinuses/Orbits: Minimal ethmoid  air cell mucosal thickening. Otherwise, the sinuses are clear. No acute orbital abnormality. Other: Small bilateral mastoid effusions. IMPRESSION: 1. No evidence of acute intracranial abnormality. Specifically, no acute infarct. 2. Partially empty sella, which may be an incidental finding but can be seen with idiopathic intracranial hypertension in the correct clinical setting. 3. Diffuse T1 hypointensity of the bone marrow, which is nonspecific but can be seen with chronic hypoxia (such as in smokers), chronic anemia, or lymphoproliferative disorders. Electronically Signed   By: Margaretha Sheffield MD   On: 11/07/2019 08:23   DG Chest Port 1 View  Result Date: 11/04/2019 CLINICAL DATA:  Recent pneumonia EXAM: PORTABLE CHEST 1 VIEW COMPARISON:  11/26/2018 FINDINGS: Prominent interstitial lung markings are again noted. There is no pneumothorax or large pleural effusion. No focal infiltrate. The heart size is stable from prior study. There is no acute osseous abnormality. IMPRESSION: Unchanged bilateral bronchitic changes. Electronically Signed   By: Constance Holster M.D.   On: 11/04/2019 00:53   ECHOCARDIOGRAM COMPLETE  Result Date: 11/08/2019    ECHOCARDIOGRAM REPORT   Patient Name:   JAEDA BRUSO [facility] Date of Exam: 11/08/2019 Medical Rec #:  [id number]             Height:       66.0 in Accession #:    [id number]            Weight:       400.0 lb Date of Birth:  25-Jun-1970            BSA:          2.685 m Patient Age:    36 years              BP:           132/86 mmHg Patient Gender: F                     HR:           81 bpm. Exam Location:  [facility] Procedure: 2D Echo, Cardiac Doppler and Color Doppler Indications:     Dyspnea  786.09  History:         Patient has prior history of Echocardiogram examinations, most                  recent 09/24/2016. Cardiomyopathy, COPD; Risk                  Factors:Hypertension. Mitral valve regurgitation.  Sonographer:     Sherrie Sport RDCS (AE) Referring Phys:  Theola Sequin Diagnosing Phys: Nelva Bush MD IMPRESSIONS  1. Left ventricular ejection fraction, by estimation, is 60 to 65%. The left ventricle has normal function. The left ventricle has no regional wall motion abnormalities. There is moderate left ventricular hypertrophy. Left ventricular diastolic parameters were normal.  2. Right ventricular systolic function is normal. The right ventricular size is normal.  3. Right atrial size was mildly dilated.  4. The mitral valve is grossly normal. No evidence of mitral valve regurgitation.  5. The aortic valve has an indeterminant number of cusps. Aortic valve regurgitation is not visualized. No aortic stenosis is present. FINDINGS  Left Ventricle: Left ventricular ejection fraction, by estimation, is 60 to 65%. The left ventricle has normal function. The left ventricle has no regional wall motion abnormalities. The left ventricular internal cavity size was normal in size. There is  moderate left ventricular hypertrophy. Left ventricular diastolic parameters were normal. Right Ventricle: The  right ventricular size is normal. No increase in right ventricular wall thickness. Right ventricular systolic function is normal. Left Atrium: Left atrial size was normal in size. Right Atrium: Right atrial size was mildly dilated. Pericardium: The pericardium was not well visualized. Mitral Valve: The mitral valve is grossly normal. No evidence of mitral valve regurgitation. Tricuspid Valve: The tricuspid valve is not well visualized. Tricuspid valve regurgitation is trivial. Aortic Valve: The aortic valve has an indeterminant number of cusps. Aortic valve regurgitation is not visualized. No aortic  stenosis is present. Aortic valve mean gradient measures 9.3 mmHg. Aortic valve peak gradient measures 17.4 mmHg. Aortic valve area, by VTI measures 1.98 cm. Pulmonic Valve: The pulmonic valve was not well visualized. Pulmonic valve regurgitation is not visualized. No evidence of pulmonic stenosis. Aorta: The aortic root is normal in size and structure. Pulmonary Artery: The pulmonary artery is of normal size. Venous: The inferior vena cava was not well visualized. IAS/Shunts: The interatrial septum was not well visualized.  LEFT VENTRICLE PLAX 2D LVIDd:         4.79 cm  Diastology LVIDs:         2.97 cm  LV e' medial:    8.27 cm/s LV PW:         1.46 cm  LV E/e' medial:  11.4 LV IVS:        1.41 cm  LV e' lateral:   12.00 cm/s LVOT diam:     2.00 cm  LV E/e' lateral: 7.9 LV SV:         67 LV SV Index:   25 LVOT Area:     3.14 cm  RIGHT VENTRICLE RV Basal diam:  4.02 cm RV S prime:     17.80 cm/s TAPSE (M-mode): 3.8 cm LEFT ATRIUM             Index       RIGHT ATRIUM           Index LA diam:        3.80 cm 1.42 cm/m  RA Area:     20.10 cm LA Vol (A2C):   46.2 ml 17.21 ml/m RA Volume:   60.50 ml  22.53 ml/m LA Vol (A4C):   36.6 ml 13.63 ml/m LA Biplane Vol: 42.3 ml 15.75 ml/m  AORTIC VALVE                    PULMONIC VALVE AV Area (Vmax):    1.60 cm     PV Vmax:        0.99 m/s AV Area (Vmean):   1.57 cm     PV Peak grad:   4.0 mmHg AV Area (VTI):     1.98 cm     RVOT Peak grad: 4 mmHg AV Vmax:           208.33 cm/s AV Vmean:          140.333 cm/s AV VTI:            0.340 m AV Peak Grad:      17.4 mmHg AV Mean Grad:      9.3 mmHg LVOT Vmax:         106.00 cm/s LVOT Vmean:        70.000 cm/s LVOT VTI:          0.214 m LVOT/AV VTI ratio: 0.63  AORTA Ao Root diam: 2.80 cm MITRAL VALVE  TRICUSPID VALVE MV Area (PHT): 2.62 cm    TR Peak grad:   18.0 mmHg MV Decel Time: 289 msec    TR Vmax:        212.00 cm/s MV E velocity: 94.60 cm/s MV A velocity: 80.00 cm/s  SHUNTS MV E/A ratio:  1.18         Systemic VTI:  0.21 m                            Systemic Diam: 2.00 cm Nelva Bush MD Electronically signed by Nelva Bush MD Signature Date/Time: 11/08/2019/3:41:14 PM    Final    DG FL GUIDED LUMBAR PUNCTURE  Result Date: 11/08/2019 CLINICAL DATA:  Blurry vision, possible idiopathic intracranial hypertension, failed bedside LP attempt EXAM: DIAGNOSTIC LUMBAR PUNCTURE UNDER FLUOROSCOPIC GUIDANCE FLUOROSCOPY TIME:  Fluoroscopy Time: 0 seconds PROCEDURE: Informed consent was obtained from the patient prior to the procedure, including potential complications of headache, allergy, and pain. With the patient prone, the lower back was prepped with Betadine. 1% Lidocaine was used for local anesthesia. Due to body habitus, radiographs were obtained rather than live fluoroscopy, which did not for sufficient visualization of anatomy. Lumbar puncture was attempted at the L2-L3 and L4-L5 levels using a 22 gauge 7 inch needle. The subarachnoid space could not be successfully accessed. The patient tolerated the procedure well and there were no apparent complications. IMPRESSION: Technically unsuccessful image guided lumbar puncture. Electronically Signed   By: Macy Mis M.D.   On: 11/08/2019 15:37     Subjective:   Patient was seen and examined 11/10/2019, 1:32 PM Patient stable today. No acute distress.  No issues overnight Stable for discharge.  Discharge Exam:    Vitals:   11/09/19 2052 11/10/19 0419 11/10/19 0828 11/10/19 1203  BP: (!) 98/54 119/74 122/76 117/63  Pulse: 82 87 79 80  Resp: '20 20 20 20  ' Temp: 98.2 F (36.8 C) 97.7 F (36.5 C) 97.9 F (36.6 C) 99 F (37.2 C)  TempSrc:  Oral Oral Oral  SpO2: 96% 97% 98% 97%  Weight:      Height:        General: Pt lying comfortably in bed & appears in no obvious distress. Cardiovascular: S1 & S2 heard, RRR, S1/S2 +. No murmurs, rubs, gallops or clicks. No JVD or pedal edema. Respiratory: Clear to auscultation without wheezing,  rhonchi or crackles. No increased work of breathing. Abdominal:  Non-distended, non-tender & soft. No organomegaly or masses appreciated. Normal bowel sounds heard. CNS: Alert and oriented. No focal deficits. Extremities: no edema, no cyanosis    The results of significant diagnostics from this hospitalization (including imaging, microbiology, ancillary and laboratory) are listed below for reference.      Microbiology:   Recent Results (from the past 240 hour(s))  Urine culture     Status: Abnormal   Collection Time: 11/03/19  8:03 PM   Specimen: Urine, Random  Result Value Ref Range Status   Specimen Description   Final    URINE, RANDOM Performed at [facility], [address] [place], [place] [address]    Special Requests   Final    NONE Performed at [facility], [address] [place], [place] [address]    Culture MULTIPLE SPECIES PRESENT, SUGGEST RECOLLECTION (A)  Final   Report Status 11/06/2019 FINAL  Final  Respiratory Panel by RT PCR (Flu A&B, Covid) - Nasopharyngeal Swab     Status:  None   Collection Time: 11/04/19 12:44 AM   Specimen: Nasopharyngeal Swab  Result Value Ref Range Status   SARS Coronavirus 2 by RT PCR NEGATIVE NEGATIVE Final    Comment: (NOTE) SARS-CoV-2 target nucleic acids are NOT DETECTED.  The SARS-CoV-2 RNA is generally detectable in upper respiratoy specimens during the acute phase of infection. The lowest concentration of SARS-CoV-2 viral copies this assay can detect is 131 copies/mL. A negative result does not preclude SARS-Cov-2 infection and should not be used as the sole basis for treatment or other patient management decisions. A negative result may occur with  improper specimen collection/handling, submission of specimen other than nasopharyngeal swab, presence of viral mutation(s) within the areas targeted by this assay, and inadequate number of viral copies (<131 copies/mL). A negative result must be combined  with clinical observations, patient history, and epidemiological information. The expected result is Negative.  Fact Sheet for Patients:  PinkCheek.be  Fact Sheet for Healthcare Providers:  GravelBags.it  This test is no t yet approved or cleared by the [place] FDA and  has been authorized for detection and/or diagnosis of SARS-CoV-2 by FDA under an Emergency Use Authorization (EUA). This EUA will remain  in effect (meaning this test can be used) for the duration of the COVID-19 declaration under Section 564(b)(1) of the Act, 21 U.S.C. section 360bbb-3(b)(1), unless the authorization is terminated or revoked sooner.     Influenza A by PCR NEGATIVE NEGATIVE Final   Influenza B by PCR NEGATIVE NEGATIVE Final    Comment: (NOTE) The Xpert Xpress SARS-CoV-2/FLU/RSV assay is intended as an aid in  the diagnosis of influenza from Nasopharyngeal swab specimens and  should not be used as a sole basis for treatment. Nasal washings and  aspirates are unacceptable for Xpert Xpress SARS-CoV-2/FLU/RSV  testing.  Fact Sheet for Patients: PinkCheek.be  Fact Sheet for Healthcare Providers: GravelBags.it  This test is not yet approved or cleared by the [place] FDA and  has been authorized for detection and/or diagnosis of SARS-CoV-2 by  FDA under an Emergency Use Authorization (EUA). This EUA will remain  in effect (meaning this test can be used) for the duration of the  Covid-19 declaration under Section 564(b)(1) of the Act, 21  U.S.C. section 360bbb-3(b)(1), unless the authorization is  terminated or revoked. Performed at [facility], [address] [place], [place] [address]      Labs:   CBC: Recent Labs  Lab 11/03/19 2003 11/04/19 0136 11/07/19 0917  WBC 11.9* 12.1* 9.2  HGB 14.4 13.8 11.5*  HCT 41.0 39.4 34.7*  MCV 85.8 86.0 89.4  PLT  298 284 149   Basic Metabolic Panel: Recent Labs  Lab 11/04/19 2218 11/04/19 2218 11/05/19 0732 11/05/19 1410 11/05/19 2100 11/06/19 0407 11/07/19 0917  NA 132*   < > 133* UNABLE TO REPORT DUE TO LIPEMIC INTERFERENCE 133* 131* 131*  K 3.5   < > 3.4* 3.8 3.8 3.6 3.9  CL 93*   < > 93* 96* 96* 94* 99  CO2 23   < > 26 18* '23 23 23  ' GLUCOSE 187*   < > 215* 265* 243* 337* 332*  BUN 14   < > '11 11 8 8 7  ' CREATININE 0.78   < > 0.75 0.74 0.77 0.76 0.63  CALCIUM 9.4   < > 9.1 8.8* 8.9 8.7* 8.5*  MG 2.0  --   --   --   --   --   --    < > =  values in this interval not displayed.   Liver Function Tests: Recent Labs  Lab 11/07/19 0917  AST 42*  ALT 32  ALKPHOS 67  BILITOT 0.9  PROT 6.4*  ALBUMIN 3.3*   BNP (last 3 results) Recent Labs    11/26/18 1839 11/07/19 0917  BNP 14.0 43.9   Cardiac Enzymes: No results for input(s): CKTOTAL, CKMB, CKMBINDEX, TROPONINI in the last 168 hours. CBG: Recent Labs  Lab 11/09/19 1151 11/09/19 1639 11/09/19 2048 11/10/19 0758 11/10/19 1202  GLUCAP 256* 156* 181* 218* 304*   Hgb A1c No results for input(s): HGBA1C in the last 72 hours. Lipid Profile No results for input(s): CHOL, HDL, LDLCALC, TRIG, CHOLHDL, LDLDIRECT in the last 72 hours. Thyroid function studies No results for input(s): TSH, T4TOTAL, T3FREE, THYROIDAB in the last 72 hours.  Invalid input(s): FREET3 Anemia work up No results for input(s): VITAMINB12, FOLATE, FERRITIN, TIBC, IRON, RETICCTPCT in the last 72 hours. Urinalysis    Component Value Date/Time   COLORURINE STRAW (A) 11/03/2019 2003   APPEARANCEUR HAZY (A) 11/03/2019 2003   LABSPEC 1.027 11/03/2019 2003   PHURINE 5.0 11/03/2019 2003   GLUCOSEU >=500 (A) 11/03/2019 2003   HGBUR MODERATE (A) 11/03/2019 2003   BILIRUBINUR NEGATIVE 11/03/2019 2003   KETONESUR 5 (A) 11/03/2019 2003   PROTEINUR NEGATIVE 11/03/2019 2003   NITRITE NEGATIVE 11/03/2019 2003   LEUKOCYTESUR SMALL (A) 11/03/2019 2003          Time coordinating discharge: Over 45 minutes  SIGNED: Deatra James, MD, FACP, [facility]. [facility],  Please use [email or web address] to Page If 7PM-7AM, please contact night-coverage [email or web address], Password [facility] 11/10/2019, 1:32 PM

## 2019-11-10 NOTE — Progress Notes (Addendum)
Inpatient Diabetes Program Recommendations  AACE/ADA: New Consensus Statement on Inpatient Glycemic Control (2015)  Target Ranges:  Prepandial:   less than 140 mg/dL      Peak postprandial:   less than 180 mg/dL (1-2 hours)      Critically ill patients:  140 - 180 mg/dL   Lab Results  Component Value Date   GLUCAP 218 (H) 11/10/2019   HGBA1C 11.4 (H) 11/04/2019    Review of Glycemic Control  Diabetes history: New Onset DM2 Current orders for Inpatient glycemic control: Novolog 70/30 45 units bid  Inpatient Diabetes Program Recommendations:   Noted patient transitioned to 70/30 insulin 40 units bid starting this am. 70/30 insulin 40 units bid = 56 units basal + 24 units meal coverage  70/30 insulin 50 units bid = 70 units basal + 30 units meal coverage  70/30 insulin 60 units bid = 80 units basal + 40 units meal coverage  Will follow during hospitalization.  Spoke with patient @ bedside and reviewed to take 70/30 insulin bid ac meals along with checking her CBGs. Patient verbalized understanding and has no further questions @ this time.  Thank you, Nani Gasser. Danialle Dement, RN, MSN, CDE  Diabetes Coordinator Inpatient Glycemic Control Team Team Pager [phone] (8am-5pm) 11/10/2019 8:56 AM

## 2019-11-10 NOTE — Progress Notes (Signed)
Patient given discharge instructions and prescriptions to take to pharmacy. Pt verbalized understanding of all discharge instructions.

## 2019-11-22 ENCOUNTER — Ambulatory Visit: Payer: Medicaid Other | Admitting: *Deleted

## 2020-01-05 ENCOUNTER — Encounter: Payer: Self-pay | Admitting: Gastroenterology

## 2020-01-05 ENCOUNTER — Other Ambulatory Visit: Payer: Self-pay

## 2020-01-05 ENCOUNTER — Ambulatory Visit (INDEPENDENT_AMBULATORY_CARE_PROVIDER_SITE_OTHER): Payer: Medicaid Other | Admitting: Gastroenterology

## 2020-01-05 VITALS — BP 134/71 | HR 91 | Ht 68.0 in | Wt >= 6400 oz

## 2020-01-05 DIAGNOSIS — R6881 Early satiety: Secondary | ICD-10-CM | POA: Diagnosis not present

## 2020-01-05 DIAGNOSIS — Z1211 Encounter for screening for malignant neoplasm of colon: Secondary | ICD-10-CM

## 2020-01-05 DIAGNOSIS — R1310 Dysphagia, unspecified: Secondary | ICD-10-CM | POA: Diagnosis not present

## 2020-01-05 DIAGNOSIS — K219 Gastro-esophageal reflux disease without esophagitis: Secondary | ICD-10-CM | POA: Diagnosis not present

## 2020-01-05 MED ORDER — NA SULFATE-K SULFATE-MG SULF 17.5-3.13-1.6 GM/177ML PO SOLN: 1.0000 | Freq: Once | ORAL | 0 refills | Status: AC

## 2020-01-05 NOTE — Progress Notes (Signed)
Jonathon Bellows MD, MRCP(U.K) [address]  [place]  [place], [place] [address]  Main: [phone]  Fax: [phone]   Gastroenterology Consultation  Referring Provider:     Gennette Pac, FNP Primary Care Physician:  Lawernce Pitts, [place] Primary Gastroenterologist:  Dr. Jonathon Bellows  Reason for Consultation:     Irritable bowel syndrome        HPI:   Paula Gilmore is a 49 y.o. y/o female referred for irritable bowel syndrome in 10/17/2019.  No recent abdominal imaging.  No recent endoscopy report available to review on epic. 11/07/2019 hemoglobin 11.5 g with an MCV of 89, CMP shows elevated blood glucose level of 332 albumin of 3.3 and creatinine of 0.63.  She says she is here today to see me for acid reflux, dysphagia and a sense of fullness in her stomach after her meals.  She states that she has had acid reflux for over 30 years.  Has gained weight till October of this year following which she has lost 40 pounds intentionally.  She has heartburn throughout the day usually worse when she eats and after she lies flat.  Takes Prilosec 40 mg twice a day.  Has had an endoscopy many years back which was normal.  Also having some dysphagia for solids recently.  No prior history of esophageal dilation.  She is also due for a colonoscopy she says no family history of colon cancer or polyps.  She has been complaining of early satiety and recently was diagnosed with diabetes. Past Medical History:  Diagnosis Date  . Acid reflux   . Arthritis   . Cancer ([place])    Cervical  . Cardiomyopathy ([facility])   . COPD (chronic obstructive pulmonary disease) ([place])   . Fibromyalgia   . Hypertension   . Migraine   . Mitral valve regurgitation     Past Surgical History:  Procedure Laterality Date  . CESAREAN SECTION  2009    Prior to Admission medications   Medication Sig Start Date End Date Taking? Authorizing Provider  acetaminophen (TYLENOL) 500 MG tablet Take 1,000 mg by mouth  every 6 (six) hours as needed.     [provider]  albuterol (PROVENTIL) (2.5 MG/3ML) 0.083% nebulizer solution Take 2.5 mg by nebulization every 6 (six) hours as needed for wheezing or shortness of breath.    [provider]  aspirin EC 81 MG tablet Take 81 mg by mouth daily. Swallow whole.    [provider]  blood glucose meter kit and supplies KIT Dispense based on patient and insurance preference. Use up to four times daily as directed. (FOR ICD-9 250.00, 250.01). 11/10/19   Shahmehdi, Valeria Batman, MD  calcium carbonate (TUMS - DOSED IN MG ELEMENTAL CALCIUM) 500 MG chewable tablet Chew 1 tablet (200 mg of elemental calcium total) by mouth 4 (four) times daily as needed for indigestion or heartburn. 11/08/19   Hosie Poisson, MD  cetirizine (ZYRTEC) 10 MG tablet Take 10 mg by mouth 2 (two) times daily.    [provider]  cholecalciferol (VITAMIN D3) 25 MCG (1000 UNIT) tablet Take 2,000 Units by mouth daily.    [provider]  dicyclomine (BENTYL) 10 MG capsule Take 10 mg by mouth 4 (four) times daily as needed for spasms.    [provider]  fluticasone (FLONASE) 50 MCG/ACT nasal spray Place 2 sprays into both nostrils daily.    [provider]  furosemide (LASIX) 20 MG tablet Take 20 mg by  mouth daily.    [provider]  insulin aspart (NOVOLOG FLEXPEN) 100 UNIT/ML FlexPen CBG 70 - 120: 0 units CBG 121 - 150: 2 units CBG 151 - 200: 3 units CBG 201 - 250: 5 units CBG 251 - 300: 8 units CBG 301 - 350: 11 units CBG 351 - 400: 15 units 11/10/19   Shahmehdi, Seyed A, MD  insulin aspart protamine- aspart (NOVOLOG MIX 70/30) (70-30) 100 UNIT/ML injection Inject 0.6 mLs (60 Units total) into the skin 2 (two) times daily with a meal. 11/10/19   Shahmehdi, Seyed A, MD  Insulin Pen Needle (PEN NEEDLES) 30G X 5 MM MISC To Use Three times a day. 11/10/19   Shahmehdi, Valeria Batman, MD  ipratropium-albuterol (DUONEB) 0.5-2.5 (3) MG/3ML SOLN  Take 3 mLs by nebulization every 6 (six) hours as needed. 11/08/19   Hosie Poisson, MD  lisinopril (ZESTRIL) 5 MG tablet Take 5 mg by mouth daily.    [provider]  loperamide (IMODIUM) 2 MG capsule Take 2 mg by mouth as needed. Patient not taking: Reported on 11/04/2019    [provider]  Multiple Vitamin (MULTIVITAMIN WITH MINERALS) TABS tablet Take 1 tablet by mouth daily.    [provider]  omeprazole (PRILOSEC) 20 MG capsule Take 2 capsules (40 mg total) by mouth daily. 11/08/19   Hosie Poisson, MD  ondansetron (ZOFRAN ODT) 4 MG disintegrating tablet Take 1 tablet (4 mg total) by mouth every 6 (six) hours as needed for nausea or vomiting. 11/08/19   Hosie Poisson, MD  SUCRALFATE PO Take 1 tablet by mouth 4 (four) times daily.     [provider]    No family history on file.   Social History   Tobacco Use  . Smoking status: Former Research scientist (life sciences)  . Smokeless tobacco: Never Used  Vaping Use  . Vaping Use: Never used  Substance Use Topics  . Alcohol use: No  . Drug use: No    Allergies as of 01/05/2020 - Review Complete 11/04/2019  Allergen Reaction Noted  . Cymbalta [duloxetine hcl] Other (See Comments) 09/24/2016  . Montelukast Hives 10/08/2018  . Tizanidine Hives 09/24/2016  . Savella [milnacipran hcl] Palpitations 01/14/2017    Review of Systems:    All systems reviewed and negative except where noted in HPI.   Physical Exam:  There were no vitals taken for this visit. No LMP recorded. (Menstrual status: Irregular Periods). Psych:  Alert and cooperative. Normal mood and affect. General:   Alert, morbidly obese short neck Head:  Normocephalic and atraumatic. Eyes:  Sclera clear, no icterus.   Conjunctiva pink. Lungs:  Respirations even and unlabored.  Clear throughout to auscultation.   No wheezes, crackles, or rhonchi. No acute distress. Heart:  Regular rate and rhythm; no murmurs, clicks, rubs, or gallops. Abdomen:  Normal bowel  sounds.  No bruits.  Soft, non-tender and non-distended without masses, hepatosplenomegaly or hernias noted.  No guarding or rebound tenderness.    Neurologic:  Alert and oriented x3;  grossly normal neurologically. Psych:  Alert and cooperative. Normal mood and affect.  Imaging Studies: No results found.  Assessment and Plan:   Paula Gilmore is a 49 y.o. y/o female has been referred for acid reflux, IBS.  Based on the patient's history she very likely has significant acid reflux due to high BMI of 62.  Likely dysphagia from acid reflux and possibly an esophageal stricture.  She also has early satiety which I suspect is from gastroparesis due to  elevated blood sugars.  I will rule out gastric outlet obstruction.  She is also due for colon cancer screening average risk.  I explained to her that her risks of anesthesia are very high due to high BMI and is a possibility that she may need intubation but that will be decided by the anesthesiologist.  In addition I counseled her about lifestyle changes for acid reflux to avoid eating for 2 hours before bedtime.  Suggested her to stop taking the Prilosec and to commence on Dexilant samples of which have been provided.  In addition I suggested that the underlying cause is due to elevated BMI which she needs to address.  I strongly suggested her to have a discussion about weight loss surgery.  In fact she says she has an appointment coming up to discuss the same.  I will see her back in 3 months after her endoscopic evaluation but I would also screen her for Barrett's esophagus.  I have discussed alternative options, risks & benefits,  which include, but are not limited to, bleeding, infection, perforation,respiratory complication & drug reaction.  The patient agrees with this plan & written consent will be obtained.     Follow up in 3 months  Dr Jonathon Bellows MD,MRCP(U.K)

## 2020-02-03 ENCOUNTER — Other Ambulatory Visit: Admission: RE | Admit: 2020-02-03 | Payer: Medicaid Other | Source: Ambulatory Visit

## 2020-02-06 ENCOUNTER — Telehealth: Payer: Self-pay

## 2020-02-06 NOTE — Telephone Encounter (Signed)
Patient requested to cancel appointment at this time due to family emergency. Will contact office to reschedule. Endo unit nurse has been notified to cancel procedure.

## 2020-02-06 NOTE — Telephone Encounter (Signed)
Patient has a procedure scheduled for 02/07/2020 but has not went for covid test

## 2020-02-07 ENCOUNTER — Ambulatory Visit: Admission: RE | Admit: 2020-02-07 | Payer: Medicaid Other | Source: Home / Self Care | Admitting: Gastroenterology

## 2020-02-07 ENCOUNTER — Encounter: Admission: RE | Payer: Self-pay | Source: Home / Self Care

## 2020-02-07 SURGERY — COLONOSCOPY WITH PROPOFOL
Anesthesia: General

## 2020-04-05 ENCOUNTER — Ambulatory Visit: Payer: Medicaid Other | Admitting: Gastroenterology

## 2021-02-28 ENCOUNTER — Emergency Department: Payer: Medicaid Other

## 2021-02-28 ENCOUNTER — Other Ambulatory Visit: Payer: Self-pay

## 2021-02-28 ENCOUNTER — Inpatient Hospital Stay
Admission: EM | Admit: 2021-02-28 | Discharge: 2021-03-06 | DRG: 637 | Disposition: A | Discharge status: Home / Self Care | Payer: Medicaid Other | Attending: Internal Medicine | Admitting: Internal Medicine

## 2021-02-28 DIAGNOSIS — G4734 Idiopathic sleep related nonobstructive alveolar hypoventilation: Secondary | ICD-10-CM

## 2021-02-28 DIAGNOSIS — R531 Weakness: Secondary | ICD-10-CM

## 2021-02-28 DIAGNOSIS — R600 Localized edema: Secondary | ICD-10-CM

## 2021-02-28 DIAGNOSIS — S81801A Unspecified open wound, right lower leg, initial encounter: Secondary | ICD-10-CM | POA: Diagnosis not present

## 2021-02-28 DIAGNOSIS — R112 Nausea with vomiting, unspecified: Secondary | ICD-10-CM

## 2021-02-28 DIAGNOSIS — I340 Nonrheumatic mitral (valve) insufficiency: Secondary | ICD-10-CM | POA: Diagnosis present

## 2021-02-28 DIAGNOSIS — E11622 Type 2 diabetes mellitus with other skin ulcer: Secondary | ICD-10-CM | POA: Diagnosis present

## 2021-02-28 DIAGNOSIS — L03119 Cellulitis of unspecified part of limb: Secondary | ICD-10-CM | POA: Diagnosis not present

## 2021-02-28 DIAGNOSIS — G4730 Sleep apnea, unspecified: Secondary | ICD-10-CM | POA: Diagnosis present

## 2021-02-28 DIAGNOSIS — L97919 Non-pressure chronic ulcer of unspecified part of right lower leg with unspecified severity: Secondary | ICD-10-CM | POA: Diagnosis present

## 2021-02-28 DIAGNOSIS — Z794 Long term (current) use of insulin: Secondary | ICD-10-CM

## 2021-02-28 DIAGNOSIS — I429 Cardiomyopathy, unspecified: Secondary | ICD-10-CM | POA: Diagnosis present

## 2021-02-28 DIAGNOSIS — K219 Gastro-esophageal reflux disease without esophagitis: Secondary | ICD-10-CM | POA: Diagnosis present

## 2021-02-28 DIAGNOSIS — J449 Chronic obstructive pulmonary disease, unspecified: Secondary | ICD-10-CM | POA: Diagnosis present

## 2021-02-28 DIAGNOSIS — R609 Edema, unspecified: Secondary | ICD-10-CM

## 2021-02-28 DIAGNOSIS — Z79899 Other long term (current) drug therapy: Secondary | ICD-10-CM | POA: Diagnosis not present

## 2021-02-28 DIAGNOSIS — Z7982 Long term (current) use of aspirin: Secondary | ICD-10-CM

## 2021-02-28 DIAGNOSIS — E119 Type 2 diabetes mellitus without complications: Secondary | ICD-10-CM

## 2021-02-28 DIAGNOSIS — Z87891 Personal history of nicotine dependence: Secondary | ICD-10-CM | POA: Diagnosis not present

## 2021-02-28 DIAGNOSIS — L089 Local infection of the skin and subcutaneous tissue, unspecified: Secondary | ICD-10-CM | POA: Diagnosis not present

## 2021-02-28 DIAGNOSIS — L03115 Cellulitis of right lower limb: Secondary | ICD-10-CM | POA: Diagnosis present

## 2021-02-28 DIAGNOSIS — I890 Lymphedema, not elsewhere classified: Secondary | ICD-10-CM | POA: Diagnosis not present

## 2021-02-28 DIAGNOSIS — Z20822 Contact with and (suspected) exposure to covid-19: Secondary | ICD-10-CM | POA: Diagnosis present

## 2021-02-28 DIAGNOSIS — I110 Hypertensive heart disease with heart failure: Secondary | ICD-10-CM | POA: Diagnosis present

## 2021-02-28 DIAGNOSIS — E1169 Type 2 diabetes mellitus with other specified complication: Secondary | ICD-10-CM

## 2021-02-28 DIAGNOSIS — K5900 Constipation, unspecified: Secondary | ICD-10-CM

## 2021-02-28 DIAGNOSIS — R519 Headache, unspecified: Secondary | ICD-10-CM | POA: Diagnosis present

## 2021-02-28 DIAGNOSIS — J9601 Acute respiratory failure with hypoxia: Secondary | ICD-10-CM

## 2021-02-28 DIAGNOSIS — L0390 Cellulitis, unspecified: Secondary | ICD-10-CM | POA: Diagnosis present

## 2021-02-28 DIAGNOSIS — M797 Fibromyalgia: Secondary | ICD-10-CM

## 2021-02-28 DIAGNOSIS — E11628 Type 2 diabetes mellitus with other skin complications: Principal | ICD-10-CM

## 2021-02-28 DIAGNOSIS — M1990 Unspecified osteoarthritis, unspecified site: Secondary | ICD-10-CM | POA: Diagnosis present

## 2021-02-28 DIAGNOSIS — Z6841 Body Mass Index (BMI) 40.0 and over, adult: Secondary | ICD-10-CM | POA: Diagnosis not present

## 2021-02-28 DIAGNOSIS — I5033 Acute on chronic diastolic (congestive) heart failure: Secondary | ICD-10-CM

## 2021-02-28 DIAGNOSIS — G8929 Other chronic pain: Secondary | ICD-10-CM | POA: Diagnosis not present

## 2021-02-28 DIAGNOSIS — T148XXA Other injury of unspecified body region, initial encounter: Secondary | ICD-10-CM | POA: Diagnosis not present

## 2021-02-28 DIAGNOSIS — Z888 Allergy status to other drugs, medicaments and biological substances status: Secondary | ICD-10-CM

## 2021-02-28 LAB — HEPATIC FUNCTION PANEL
ALT: 27 U/L (ref 0–44)
AST: 23 U/L (ref 15–41)
Albumin: 3.7 g/dL (ref 3.5–5.0)
Alkaline Phosphatase: 79 U/L (ref 38–126)
Bilirubin, Direct: <0.1 mg/dL (ref 0.0–0.2)
Total Bilirubin: 0.6 mg/dL (ref 0.3–1.2)
Total Protein: 7.5 g/dL (ref 6.5–8.1)

## 2021-02-28 LAB — LACTIC ACID, PLASMA
Lactic Acid, Venous: 1.8 mmol/L (ref 0.5–1.9)
Lactic Acid, Venous: 2.1 mmol/L (ref 0.5–1.9)
Lactic Acid, Venous: 1.1 mmol/L (ref 0.5–1.9)

## 2021-02-28 LAB — GLUCOSE, CAPILLARY
Glucose-Capillary: 117 mg/dL — ABNORMAL HIGH (ref 70–99)
Glucose-Capillary: 141 mg/dL — ABNORMAL HIGH (ref 70–99)

## 2021-02-28 LAB — CBC WITH DIFFERENTIAL/PLATELET
Abs Immature Granulocytes: 0.04 10*3/uL (ref 0.00–0.07)
Basophils Absolute: 0.1 10*3/uL (ref 0.0–0.1)
Basophils Relative: 1 %
Eosinophils Absolute: 0.3 10*3/uL (ref 0.0–0.5)
Eosinophils Relative: 2 %
HCT: 43.9 % (ref 36.0–46.0)
Hemoglobin: 13.7 g/dL (ref 12.0–15.0)
Immature Granulocytes: 0 %
Lymphocytes Relative: 16 %
Lymphs Abs: 2 10*3/uL (ref 0.7–4.0)
MCH: 28.7 pg (ref 26.0–34.0)
MCHC: 31.2 g/dL (ref 30.0–36.0)
MCV: 92 fL (ref 80.0–100.0)
Monocytes Absolute: 0.9 10*3/uL (ref 0.1–1.0)
Monocytes Relative: 7 %
Neutro Abs: 9.2 10*3/uL — ABNORMAL HIGH (ref 1.7–7.7)
Neutrophils Relative %: 74 %
Platelets: 327 10*3/uL (ref 150–400)
RBC: 4.77 MIL/uL (ref 3.87–5.11)
RDW: 14.1 % (ref 11.5–15.5)
WBC: 12.5 10*3/uL — ABNORMAL HIGH (ref 4.0–10.5)
nRBC: 0 % (ref 0.0–0.2)

## 2021-02-28 LAB — BASIC METABOLIC PANEL
Anion gap: 9 (ref 5–15)
BUN: 18 mg/dL (ref 6–20)
CO2: 24 mmol/L (ref 22–32)
Calcium: 9 mg/dL (ref 8.9–10.3)
Chloride: 104 mmol/L (ref 98–111)
Creatinine, Ser: 0.92 mg/dL (ref 0.44–1.00)
GFR, Estimated: >60 mL/min (ref >60)
Glucose, Bld: 136 mg/dL — ABNORMAL HIGH (ref 70–99)
Potassium: 4.5 mmol/L (ref 3.5–5.1)
Sodium: 137 mmol/L (ref 135–145)

## 2021-02-28 LAB — CK: Total CK: 70 U/L (ref 38–234)

## 2021-02-28 LAB — HIV ANTIBODY (ROUTINE TESTING W REFLEX): HIV Screen 4th Generation wRfx: NONREACTIVE

## 2021-02-28 LAB — RESP PANEL BY RT-PCR (FLU A&B, COVID) ARPGX2
Influenza A by PCR: NEGATIVE
Influenza B by PCR: NEGATIVE
SARS Coronavirus 2 by RT PCR: NEGATIVE

## 2021-02-28 LAB — HCG, QUANTITATIVE, PREGNANCY: hCG, Beta Chain, Quant, S: <1 m[IU]/mL (ref <5)

## 2021-02-28 MED ORDER — ONDANSETRON 4 MG PO TBDP
4.0000 mg | ORAL_TABLET | Freq: Four times a day (QID) | ORAL | Status: DC | PRN
  Administered 2021-03-04 – 2021-03-05 (×2): 4 mg via ORAL
  Filled 2021-02-28 (×3): qty 1

## 2021-02-28 MED ORDER — IPRATROPIUM-ALBUTEROL 0.5-2.5 (3) MG/3ML IN SOLN: 3.0000 mL | Freq: Four times a day (QID) | RESPIRATORY_TRACT | Status: DC | PRN

## 2021-02-28 MED ORDER — ENOXAPARIN SODIUM 100 MG/ML IJ SOSY
0.5000 mg/kg | PREFILLED_SYRINGE | INTRAMUSCULAR | Status: DC
  Administered 2021-02-28 – 2021-03-04 (×5): 82.5 mg via SUBCUTANEOUS
  Filled 2021-02-28 (×6): qty 0.82

## 2021-02-28 MED ORDER — PANTOPRAZOLE SODIUM 40 MG PO TBEC
40.0000 mg | DELAYED_RELEASE_TABLET | Freq: Every day | ORAL | Status: DC
  Administered 2021-03-01 – 2021-03-06 (×6): 40 mg via ORAL
  Filled 2021-02-28 (×6): qty 1

## 2021-02-28 MED ORDER — MORPHINE SULFATE (PF) 4 MG/ML IV SOLN
6.0000 mg | Freq: Once | INTRAVENOUS | Status: AC
  Administered 2021-02-28: 6 mg via INTRAVENOUS
  Filled 2021-02-28: qty 2

## 2021-02-28 MED ORDER — VANCOMYCIN HCL 1500 MG/300ML IV SOLN
1500.0000 mg | Freq: Once | INTRAVENOUS | Status: AC
Start: 2021-02-28 — End: 2021-02-28
  Administered 2021-02-28: 1500 mg via INTRAVENOUS
  Filled 2021-02-28 (×2): qty 300

## 2021-02-28 MED ORDER — BACID PO TABS
2.0000 | ORAL_TABLET | Freq: Three times a day (TID) | ORAL | Status: DC
  Filled 2021-02-28 (×3): qty 2

## 2021-02-28 MED ORDER — INSULIN ASPART 100 UNIT/ML IJ SOLN
0.0000 [IU] | Freq: Three times a day (TID) | INTRAMUSCULAR | Status: DC
  Administered 2021-03-01 – 2021-03-02 (×3): 3 [IU] via SUBCUTANEOUS
  Administered 2021-03-03: 4 [IU] via SUBCUTANEOUS
  Administered 2021-03-03 – 2021-03-04 (×2): 3 [IU] via SUBCUTANEOUS
  Administered 2021-03-04: 4 [IU] via SUBCUTANEOUS
  Administered 2021-03-05: 3 [IU] via SUBCUTANEOUS
  Administered 2021-03-05: 4 [IU] via SUBCUTANEOUS
  Administered 2021-03-06: 3 [IU] via SUBCUTANEOUS
  Filled 2021-02-28 (×11): qty 1

## 2021-02-28 MED ORDER — SODIUM CHLORIDE 0.9 % IV SOLN
2.0000 g | Freq: Once | INTRAVENOUS | Status: AC
  Administered 2021-02-28: 2 g via INTRAVENOUS
  Filled 2021-02-28: qty 2

## 2021-02-28 MED ORDER — HYDROMORPHONE HCL 1 MG/ML IJ SOLN
0.5000 mg | INTRAMUSCULAR | Status: DC | PRN
  Administered 2021-02-28 – 2021-03-05 (×12): 0.5 mg via INTRAVENOUS
  Filled 2021-02-28 (×13): qty 1

## 2021-02-28 MED ORDER — OXYCODONE HCL 5 MG PO TABS
5.0000 mg | ORAL_TABLET | Freq: Four times a day (QID) | ORAL | Status: DC | PRN
  Administered 2021-02-28: 5 mg via ORAL
  Filled 2021-02-28: qty 1

## 2021-02-28 MED ORDER — DICYCLOMINE HCL 10 MG PO CAPS
10.0000 mg | ORAL_CAPSULE | Freq: Four times a day (QID) | ORAL | Status: DC | PRN
  Filled 2021-02-28: qty 1

## 2021-02-28 MED ORDER — FUROSEMIDE 10 MG/ML IJ SOLN
60.0000 mg | Freq: Two times a day (BID) | INTRAMUSCULAR | Status: DC
  Administered 2021-02-28 – 2021-03-06 (×12): 60 mg via INTRAVENOUS
  Filled 2021-02-28 (×12): qty 8

## 2021-02-28 MED ORDER — SODIUM CHLORIDE 0.9 % IV SOLN
2.0000 g | Freq: Three times a day (TID) | INTRAVENOUS | Status: AC
  Administered 2021-02-28 – 2021-03-01 (×3): 2 g via INTRAVENOUS
  Filled 2021-02-28 (×6): qty 2

## 2021-02-28 MED ORDER — LORATADINE 10 MG PO TABS
10.0000 mg | ORAL_TABLET | Freq: Every day | ORAL | Status: DC
  Administered 2021-03-01 – 2021-03-06 (×6): 10 mg via ORAL
  Filled 2021-02-28 (×6): qty 1

## 2021-02-28 MED ORDER — SUCRALFATE 1 GM/10ML PO SUSP
1.0000 g | Freq: Three times a day (TID) | ORAL | Status: DC
  Administered 2021-02-28 – 2021-03-06 (×22): 1 g via ORAL
  Filled 2021-02-28 (×26): qty 10

## 2021-02-28 MED ORDER — INSULIN GLARGINE-YFGN 100 UNIT/ML ~~LOC~~ SOLN
35.0000 [IU] | Freq: Every day | SUBCUTANEOUS | Status: DC
  Administered 2021-02-28: 35 [IU] via SUBCUTANEOUS
  Filled 2021-02-28 (×2): qty 0.35

## 2021-02-28 MED ORDER — ALBUTEROL SULFATE (2.5 MG/3ML) 0.083% IN NEBU: 2.5000 mg | INHALATION_SOLUTION | Freq: Four times a day (QID) | RESPIRATORY_TRACT | Status: DC | PRN

## 2021-02-28 MED ORDER — VANCOMYCIN HCL 1250 MG/250ML IV SOLN
1250.0000 mg | Freq: Two times a day (BID) | INTRAVENOUS | Status: DC
  Administered 2021-03-01: 1250 mg via INTRAVENOUS
  Filled 2021-02-28 (×2): qty 250

## 2021-02-28 MED ORDER — ASPIRIN EC 81 MG PO TBEC
81.0000 mg | DELAYED_RELEASE_TABLET | Freq: Every day | ORAL | Status: DC
  Administered 2021-03-01 – 2021-03-06 (×6): 81 mg via ORAL
  Filled 2021-02-28 (×6): qty 1

## 2021-02-28 MED ORDER — ACETAMINOPHEN 500 MG PO TABS
1000.0000 mg | ORAL_TABLET | Freq: Four times a day (QID) | ORAL | Status: DC | PRN
  Administered 2021-03-01 – 2021-03-06 (×3): 1000 mg via ORAL
  Filled 2021-02-28 (×3): qty 2

## 2021-02-28 MED ORDER — INSULIN ASPART PROT & ASPART (70-30 MIX) 100 UNIT/ML ~~LOC~~ SUSP: 30.0000 [IU] | Freq: Two times a day (BID) | SUBCUTANEOUS | Status: DC

## 2021-02-28 MED ORDER — VANCOMYCIN HCL IN DEXTROSE 1-5 GM/200ML-% IV SOLN
1000.0000 mg | Freq: Once | INTRAVENOUS | Status: AC
  Administered 2021-02-28: 1000 mg via INTRAVENOUS
  Filled 2021-02-28: qty 200

## 2021-02-28 MED ORDER — CALCIUM CARBONATE ANTACID 500 MG PO CHEW: 1.0000 | CHEWABLE_TABLET | Freq: Four times a day (QID) | ORAL | Status: DC | PRN

## 2021-02-28 MED ORDER — KETOROLAC TROMETHAMINE 30 MG/ML IJ SOLN: 30.0000 mg | Freq: Four times a day (QID) | INTRAMUSCULAR | Status: DC | PRN

## 2021-02-28 MED ORDER — INSULIN ASPART 100 UNIT/ML IJ SOLN: 0.0000 [IU] | Freq: Every day | INTRAMUSCULAR | Status: DC

## 2021-02-28 MED ORDER — MORPHINE SULFATE (PF) 4 MG/ML IV SOLN
4.0000 mg | Freq: Once | INTRAVENOUS | Status: AC
  Administered 2021-02-28: 4 mg via INTRAVENOUS
  Filled 2021-02-28: qty 1

## 2021-02-28 MED ORDER — ALPRAZOLAM 0.25 MG PO TABS
0.2500 mg | ORAL_TABLET | Freq: Three times a day (TID) | ORAL | Status: DC | PRN
  Administered 2021-02-28 – 2021-03-06 (×11): 0.25 mg via ORAL
  Filled 2021-02-28 (×13): qty 1

## 2021-02-28 MED ORDER — RISAQUAD PO CAPS
2.0000 | ORAL_CAPSULE | Freq: Three times a day (TID) | ORAL | Status: DC
  Administered 2021-02-28 – 2021-03-06 (×15): 2 via ORAL
  Filled 2021-02-28 (×17): qty 2

## 2021-02-28 MED ORDER — FLUTICASONE PROPIONATE 50 MCG/ACT NA SUSP
2.0000 | Freq: Every day | NASAL | Status: DC | PRN
  Filled 2021-02-28: qty 16

## 2021-02-28 MED ORDER — ONDANSETRON HCL 4 MG/2ML IJ SOLN
4.0000 mg | INTRAMUSCULAR | Status: AC
  Administered 2021-02-28: 4 mg via INTRAVENOUS
  Filled 2021-02-28: qty 2

## 2021-02-28 NOTE — ED Notes (Signed)
Wound Care RN at bedside

## 2021-02-28 NOTE — Progress Notes (Signed)
PHARMACY -  BRIEF ANTIBIOTIC NOTE   Pharmacy has received consult(s) for vancomycin and cefepime from an ED provider.  The patient's profile has been reviewed for ht/wt/allergies/indication/available labs.    One time order(s) placed for: Cefepime 2 g  Vancomycin 1 g (no weight available)  Further antibiotics/pharmacy consults should be ordered by admitting physician if indicated.                       Thank you, Forde Dandy Shantale Holtmeyer 02/28/2021  10:30 AM

## 2021-02-28 NOTE — Progress Notes (Signed)
Pharmacy Antibiotic Note  Paula Gilmore is a 51 y.o. female admitted on 02/28/2021 with cellulitis.  Pharmacy has been consulted for vancomycin and cefepime dosing.  Plan: Cefepime 2 g IV q8h Pt received vancomycin 1 g IV x 1 dose in ED. Will order additional 1.5 g for a total loading dose of 2.5 g followed by 1250 mg IV q12h Est AUC: 503.9 Used: Scr 0.92, IBW, Vd 0.5  Obtain vanc level around 4th or 5th dose if continued Monitor renal function and adjust dose as clinically indicated     Temp (24hrs), Avg:97.8 F (36.6 C), Min:97.8 F (36.6 C), Max:97.8 F (36.6 C)  Recent Labs  Lab 02/28/21 1039  WBC 12.5*  CREATININE 0.92  LATICACIDVEN 1.8    CrCl cannot be calculated (Unknown ideal weight.).    Allergies  Allergen Reactions   Cymbalta [Duloxetine Hcl] Other (See Comments)    V-tach   Montelukast Hives   Tizanidine Hives   Savella [Milnacipran Hcl] Palpitations    Antimicrobials this admission: 2/2 cefepime >>  2/2 vancomycin >>    Microbiology results: 2/2 BCx: sent  Thank you for allowing pharmacy to be a part of this patients care.  Forde Dandy Kazi Montoro 02/28/2021 12:49 PM

## 2021-02-28 NOTE — ED Provider Notes (Signed)
[facility] Provider Note   Event Date/Time   First MD Initiated Contact with Patient 02/28/21 1017     (approximate)  History   Wound Infection (RLL)  HPI  Paula Gilmore is a 51 y.o. female with a history of diabetes.  She has a wound over her right inner thigh that is been present for about 2 months she has not been able to get to the doctor's office to get it treated.  Continues to worsen over the last 2 months, today she was going to the doctor's office but her wheelchair got stuck when try to get a wheelchair van.  Patient denies fevers.  Reports redness and pain over the right skin of her right lower leg.  She is diabetic   Physical Exam   Triage Vital Signs: ED Triage Vitals  Enc Vitals Group     BP      Pulse      Resp      Temp      Temp src      SpO2      Weight      Height      Head Circumference      Peak Flow      Pain Score      Pain Loc      Pain Edu?      Excl. in [place]?     Most recent vital signs: Vitals:   02/28/21 1115 02/28/21 1130  BP:    Pulse: 85 89  Resp:    Temp:    SpO2: 95%    Vitals:   02/28/21 1115 02/28/21 1130  BP:    Pulse: 85 89  Resp:    Temp:    SpO2: 95%        General: Awake, no distress.  He does appear in quite a bit of pain that when she goes to move the right lower leg. CV:  Good peripheral perfusion.  Easily dopplerable dorsalis pedis and posterior tibial pulses in the right leg Resp:  Normal effort.  Abd:  No distention.  Other:  Right lower thigh demonstrates a couple of shallow ulcerative lesions with extensive erythema and cellulitic changes from the ankle to the level of the proximal anterior tibia fairly circumferential but more so involving the medial aspects of the left thigh.  There is no evidence of frank necrosis.  There is no crepitance.  There is no obvious draining abscess or area of large fluctuance   ED Results / Procedures / Treatments   Labs (all labs  ordered are listed, but only abnormal results are displayed) Labs Reviewed  CBC WITH DIFFERENTIAL/PLATELET - Abnormal; Notable for the following components:      Result Value   WBC 12.5 (*)    Neutro Abs 9.2 (*)    All other components within normal limits  BASIC METABOLIC PANEL - Abnormal; Notable for the following components:   Glucose, Bld 136 (*)    All other components within normal limits  CULTURE, BLOOD (ROUTINE X 2)  CULTURE, BLOOD (ROUTINE X 2)  RESP PANEL BY RT-PCR (FLU A&B, COVID) ARPGX2  LACTIC ACID, PLASMA  HCG, QUANTITATIVE, PREGNANCY  CK  LACTIC ACID, PLASMA  HEPATIC FUNCTION PANEL  HEMOGLOBIN A1C  HIV ANTIBODY (ROUTINE TESTING W REFLEX)     EKG     RADIOLOGY  Personally viewed imaging of the patient's right tib-fib, I do not see evidence of gas-forming.  However there does appear to  be overlying edema      PROCEDURES:  Critical Care performed: No  Procedures   MEDICATIONS ORDERED IN ED: Medications  acetaminophen (TYLENOL) tablet 1,000 mg (has no administration in time range)  aspirin EC tablet 81 mg (has no administration in time range)  insulin aspart protamine- aspart (NOVOLOG MIX 70/30) injection 30 Units (has no administration in time range)  calcium carbonate (TUMS - dosed in mg elemental calcium) chewable tablet 200 mg of elemental calcium (has no administration in time range)  dicyclomine (BENTYL) capsule 10 mg (has no administration in time range)  pantoprazole (PROTONIX) EC tablet 40 mg (has no administration in time range)  lactobacillus acidophilus (BACID) tablet 2 tablet (has no administration in time range)  ondansetron (ZOFRAN-ODT) disintegrating tablet 4 mg (has no administration in time range)  sucralfate (CARAFATE) 1 GM/10ML suspension 1 g (has no administration in time range)  albuterol (PROVENTIL) (2.5 MG/3ML) 0.083% nebulizer solution 2.5 mg (has no administration in time range)  loratadine (CLARITIN) tablet 10 mg (has no  administration in time range)  fluticasone (FLONASE) 50 MCG/ACT nasal spray 2 spray (has no administration in time range)  ipratropium-albuterol (DUONEB) 0.5-2.5 (3) MG/3ML nebulizer solution 3 mL (has no administration in time range)  insulin aspart (novoLOG) injection 0-5 Units (has no administration in time range)  insulin aspart (novoLOG) injection 0-20 Units (has no administration in time range)  enoxaparin (LOVENOX) injection 82.5 mg (has no administration in time range)  furosemide (LASIX) injection 60 mg (60 mg Intravenous Given 02/28/21 1310)  HYDROmorphone (DILAUDID) injection 0.5 mg (has no administration in time range)  vancomycin (VANCOREADY) IVPB 1500 mg/300 mL (has no administration in time range)  ceFEPIme (MAXIPIME) 2 g in sodium chloride 0.9 % 100 mL IVPB (has no administration in time range)  ondansetron (ZOFRAN) injection 4 mg (4 mg Intravenous Given 02/28/21 1059)  morphine (PF) 4 MG/ML injection 6 mg (6 mg Intravenous Given 02/28/21 1100)  ceFEPIme (MAXIPIME) 2 g in sodium chloride 0.9 % 100 mL IVPB (0 g Intravenous Stopped 02/28/21 1136)  vancomycin (VANCOCIN) IVPB 1000 mg/200 mL premix (1,000 mg Intravenous New Bag/Given 02/28/21 1222)  morphine (PF) 4 MG/ML injection 4 mg (4 mg Intravenous Given 02/28/21 1311)     IMPRESSION / MDM / ASSESSMENT AND PLAN / ED COURSE  I reviewed the triage vital signs and the nursing notes.                              Differential diagnosis includes, but is not limited to, diabetic wound, cellulitis, deep tissue infection.  By clinical exam and assessment I do not see evidence of acute necrotizing process.  We will start patient on broad-spectrum antibiotic, admit to the hospitalist for further care, placed consult to wound management.  Pain control with morphine.  Neurovascularly intact distal to the right lower extremity.  At this point she does have elevated white count but does not exhibit heart rate greater than 90, tachypnea, or fever.  She  does not meet sepsis criteria at this time         FINAL CLINICAL IMPRESSION(S) / ED DIAGNOSES   Final diagnoses:  Cellulitis of right lower extremity  Wound infection     Rx / DC Orders   ED Discharge Orders     None        Note:  This document was prepared using Dragon voice recognition software and may include unintentional dictation errors.  Delman Kitten, MD 02/28/21 1324

## 2021-02-28 NOTE — ED Triage Notes (Addendum)
Pt to ED via [facility] from home. Pt has RLL wound that has been present for months. Pt attempting to go to PCP via [facility] transportation but transport hasn't shown up twice and this morning pt was left in the rain since 9am waiting for transportation to PCP. Pt denies N/V/D or fevers. EMS VSS. CBG 127. Pt is diabetic. Pt's son has been wrapping wound at home. DP pulse present

## 2021-02-28 NOTE — ED Notes (Signed)
Bedside commode placed at bedside.  

## 2021-02-28 NOTE — Plan of Care (Signed)
Patient admitted from ED

## 2021-02-28 NOTE — Consult Note (Signed)
WOC Nurse Consult Note: Patient receiving care in [facility] ED5. Primary RN present at time of my arrival Reason for Consult: right leg wound Wound type: Patient has untreated lymphedema with ulcerations to the RLE pre-tibial area and RL posterior leg. The pretibial wound is 100% pink and consistent with a ruptured bulla.  The patient did tell me it had recently been a blister and it popped.  She also had a much larger, more ulcerative wound on the right posterior lower leg at the gaiter area that is pink and yellow, that extended from the ankle area up the calf of the leg. Because the patient could not comfortably move her RLE, I was unable to measure the larger wound on the posterior LE. Pressure Injury POA: NA Measurement: Wound bed: as described above Drainage (amount, consistency, odor)   The patient was sitting up on the stretcher with her right leg dangling down, and foot resting on 2 disposable underpads which were heavily saturated with serous fluid.  Periwound: intact with edema and erythema  Dressing procedure/placement/frequency: Unfortunately, containing this volume of drainage with an absorptive dressing will be nearly impossible.  Which, in itself, can cause additional moisture damage and further tissue destruction.  I asked if she had ever been to a lymphedema clinic and she responded that she had not, and that getting to doctor appointments has been an ongoing challenge for her.  Immediately below are local lymphedema resources that may prove helpful to her.  Treatment of lymphedema exceeds the scope of practice of the [facility] nurse.  Lymphedema  Resources (updated 11/2020) Each site requires a referral from your primary care MD [facility] [address], [place]  [phone] (Upper extremities)  [place], [place] [phone] (Lower extremities, PATIENT CAN NOT HAVE A WOUND)  [place]. [address] [place], [place] [address] [phone] [facility] [place], [place] [address] [phone], [address] Medical Office Building [place], [place] [phone]  [facility] [address], [place] [address] [phone]  [facility]  (only treatment for lymphedema related to cancer diagnosis) [place], [place] [address] [phone]    [facility] [place], [place] [address] [phone] [facility] [id number] [place], [place] [address] [phone]  For now, I have placed orders for the following topical approach to attempt to absorb some of the fluid and protect the site from further injury.  It will not solve her problems and it will not be adequate.  The dressings will need to be change often to prevent further damage from drainage.  Use No Rinse Spray cleanser (from clean utility) to cleanse the wound areas on the RLE. Place Xeroform gauzes over all open wounds. Top with multiple ABD pads. Secure with Kerlix.  Wash between the toes of the right foot. Weave a strip of Aquacel Advantage Kellie Simmering [phone]) between the toes of the right foot.  Change the dressings to the leg and foot when they are soiled with drainage.  [facility] nurse will not follow at this time.  Please re-consult the [facility] team if needed.  Val Riles, RN, MSN, CWOCN, CNS-BC, pager [phone]

## 2021-02-28 NOTE — H&P (Signed)
History and Physical    [name] VBT:[id number] DOB: 08/11/1970 DOA: 02/28/2021  PCP: Theotis Burrow, MD (Confirm with patient/family/NH records and if not entered, this has to be entered at [facility] point of entry) Patient coming from: Home  I have personally briefly reviewed patient's old medical records in [place]  Chief Complaint: Right leg unhealing wound and pain  HPI: Paula Gilmore is a 51 y.o. female with medical history significant of IDDM, HTN, chronic lymphedema on Lasix, history of cardiomyopathy with recovered LVEF 60-65% 2021, morbid obesity, COPD, chronic right lower extremity wounds came with worsening of pain, discharge of the right leg wound.  Patient has had poor wounds on right lower extremities developed in summertime, plan initially was a small opening done gradually getting worse.  For the last several month, she has been doing self cleaning and wound care at home by herself and her family member using diluted peroxide and coconut oil.  But increasingly, she noticed increasing of size of the wound and the depth of the wound and more discharge than before since last week associated with more frequent throbbing-like pain worsening with minimum movement of the leg.  Denies any fever chills.  She called her PCP about the wound and referred to see wound care however for different reasons such as transportation she has never been seen by wound care or podiatry.  At baseline, she is very much sedentary life with limited mobility secondary to her body habitus and lymphedema.  She takes only 20 mg Lasix daily, and she has been noticing increasing swelling of the legs.  She denies any cough no shortness of breath or chest pains.  She was never treated with antibiotics for the right leg wounds.  ED Course: Patient was found afebrile, no hypotension no tachycardia.  WBC 12.5, creatinine 0.9, potassium 4.5.  X-ray showed extensive soft tissue edema in the  lower extremities no subcutaneous gas or foreign body.  ED started patient on vancomycin and cefepime.  Review of Systems: As per HPI otherwise 14 point review of systems negative.    Past Medical History:  Diagnosis Date   Acid reflux    Arthritis    Cancer ([place])    Cervical   Cardiomyopathy (HCC)    COPD (chronic obstructive pulmonary disease) (HCC)    Fibromyalgia    Hypertension    Migraine    Mitral valve regurgitation     Past Surgical History:  Procedure Laterality Date   CESAREAN SECTION  2009     reports that she has quit smoking. She has never used smokeless tobacco. She reports that she does not drink alcohol and does not use drugs.  Allergies  Allergen Reactions   Cymbalta [Duloxetine Hcl] Other (See Comments)    V-tach   Montelukast Hives   Tizanidine Hives   Savella [Milnacipran Hcl] Palpitations    History reviewed. No pertinent family history.   Prior to Admission medications   Medication Sig Start Date End Date Taking? Authorizing Provider  acetaminophen (TYLENOL) 500 MG tablet Take 1,000 mg by mouth every 6 (six) hours as needed.     [provider]  albuterol (PROVENTIL) (2.5 MG/3ML) 0.083% nebulizer solution Take 2.5 mg by nebulization every 6 (six) hours as needed for wheezing or shortness of breath.    [provider]  aspirin EC 81 MG tablet Take 81 mg by mouth daily. Swallow whole.    [provider]  blood glucose meter kit and supplies  KIT Dispense based on patient and insurance preference. Use up to four times daily as directed. (FOR ICD-9 250.00, 250.01). 11/10/19   Shahmehdi, Valeria Batman, MD  calcium carbonate (TUMS - DOSED IN MG ELEMENTAL CALCIUM) 500 MG chewable tablet Chew 1 tablet (200 mg of elemental calcium total) by mouth 4 (four) times daily as needed for indigestion or heartburn. 11/08/19   Hosie Poisson, MD  cetirizine (ZYRTEC) 10 MG tablet Take 10 mg by mouth 2 (two) times daily.    [provider]   cholecalciferol (VITAMIN D3) 25 MCG (1000 UNIT) tablet Take 2,000 Units by mouth daily.    [provider]  dicyclomine (BENTYL) 10 MG capsule Take 10 mg by mouth 4 (four) times daily as needed for spasms.    [provider]  fluticasone (FLONASE) 50 MCG/ACT nasal spray Place 2 sprays into both nostrils daily.    [provider]  furosemide (LASIX) 20 MG tablet Take 20 mg by mouth daily.    [provider]  insulin aspart (NOVOLOG FLEXPEN) 100 UNIT/ML FlexPen CBG 70 - 120: 0 units CBG 121 - 150: 2 units CBG 151 - 200: 3 units CBG 201 - 250: 5 units CBG 251 - 300: 8 units CBG 301 - 350: 11 units CBG 351 - 400: 15 units 11/10/19   Shahmehdi, Seyed A, MD  insulin aspart protamine- aspart (NOVOLOG MIX 70/30) (70-30) 100 UNIT/ML injection Inject 0.6 mLs (60 Units total) into the skin 2 (two) times daily with a meal. 11/10/19   Shahmehdi, Seyed A, MD  Insulin Pen Needle (PEN NEEDLES) 30G X 5 MM MISC To Use Three times a day. 11/10/19   Shahmehdi, Valeria Batman, MD  ipratropium-albuterol (DUONEB) 0.5-2.5 (3) MG/3ML SOLN Take 3 mLs by nebulization every 6 (six) hours as needed. 11/08/19   Hosie Poisson, MD  lisinopril (ZESTRIL) 5 MG tablet Take 5 mg by mouth daily.    [provider]  loperamide (IMODIUM) 2 MG capsule Take 2 mg by mouth as needed.    [provider]  Multiple Vitamin (MULTIVITAMIN WITH MINERALS) TABS tablet Take 1 tablet by mouth daily.    [provider]  omeprazole (PRILOSEC) 20 MG capsule Take 2 capsules (40 mg total) by mouth daily. 11/08/19   Hosie Poisson, MD  ondansetron (ZOFRAN ODT) 4 MG disintegrating tablet Take 1 tablet (4 mg total) by mouth every 6 (six) hours as needed for nausea or vomiting. 11/08/19   Hosie Poisson, MD  SUCRALFATE PO Take 1 tablet by mouth 4 (four) times daily.     [provider]    Physical Exam: Vitals:   02/28/21 1045 02/28/21 1100 02/28/21 1115 02/28/21 1130  BP:      Pulse: 89  88 85 89  Resp:      Temp:      TempSrc:      SpO2: 96% 99% 95%     Constitutional: NAD, calm, comfortable Vitals:   02/28/21 1045 02/28/21 1100 02/28/21 1115 02/28/21 1130  BP:      Pulse: 89 88 85 89  Resp:      Temp:      TempSrc:      SpO2: 96% 99% 95%    Eyes: PERRL, lids and conjunctivae normal ENMT: Mucous membranes are moist. Posterior pharynx clear of any exudate or lesions.Normal dentition.  Neck: normal, supple, no masses, no thyromegaly Respiratory: clear to auscultation bilaterally, no wheezing, no crackles. Normal respiratory effort. No accessory muscle use.  Cardiovascular: Regular rate  and rhythm, no murmurs / rubs / gallops. 2+ extremity edema. 2+ pedal pulses. No carotid bruits.  Abdomen: no tenderness, no masses palpated. No hepatosplenomegaly. Bowel sounds positive.  Musculoskeletal: no clubbing / cyanosis. No joint deformity upper and lower extremities. Good ROM, no contractures. Normal muscle tone.  Skin: Large shallow ulcer with irregular border on right lower extremity popliteal, with copious thin straw-colored discharge.  Severe tenderness upon touching of the right.  Once more shallow wound on lower third shin area.  Bilateral leg chronic lymphedema. Neurologic: CN 2-12 grossly intact. Sensation intact, DTR normal. Strength 5/5 in all 4.  Psychiatric: Normal judgment and insight. Alert and oriented x 3. Normal mood.    Labs on Admission: I have personally reviewed following labs and imaging studies  CBC: Recent Labs  Lab 02/28/21 1039  WBC 12.5*  NEUTROABS 9.2*  HGB 13.7  HCT 43.9  MCV 92.0  PLT 466   Basic Metabolic Panel: Recent Labs  Lab 02/28/21 1039  NA 137  K 4.5  CL 104  CO2 24  GLUCOSE 136*  BUN 18  CREATININE 0.92  CALCIUM 9.0   GFR: CrCl cannot be calculated (Unknown ideal weight.). Liver Function Tests: No results for input(s): AST, ALT, ALKPHOS, BILITOT, PROT, ALBUMIN in the last 168 hours. No results for input(s):  LIPASE, AMYLASE in the last 168 hours. No results for input(s): AMMONIA in the last 168 hours. Coagulation Profile: No results for input(s): INR, PROTIME in the last 168 hours. Cardiac Enzymes: No results for input(s): CKTOTAL, CKMB, CKMBINDEX, TROPONINI in the last 168 hours. BNP (last 3 results) No results for input(s): PROBNP in the last 8760 hours. HbA1C: No results for input(s): HGBA1C in the last 72 hours. CBG: No results for input(s): GLUCAP in the last 168 hours. Lipid Profile: No results for input(s): CHOL, HDL, LDLCALC, TRIG, CHOLHDL, LDLDIRECT in the last 72 hours. Thyroid Function Tests: No results for input(s): TSH, T4TOTAL, FREET4, T3FREE, THYROIDAB in the last 72 hours. Anemia Panel: No results for input(s): VITAMINB12, FOLATE, FERRITIN, TIBC, IRON, RETICCTPCT in the last 72 hours. Urine analysis:    Component Value Date/Time   COLORURINE STRAW (A) 11/03/2019 2003   APPEARANCEUR HAZY (A) 11/03/2019 2003   LABSPEC 1.027 11/03/2019 2003   PHURINE 5.0 11/03/2019 2003   GLUCOSEU >=500 (A) 11/03/2019 2003   HGBUR MODERATE (A) 11/03/2019 2003   BILIRUBINUR NEGATIVE 11/03/2019 2003   KETONESUR 5 (A) 11/03/2019 2003   PROTEINUR NEGATIVE 11/03/2019 2003   NITRITE NEGATIVE 11/03/2019 2003   LEUKOCYTESUR SMALL (A) 11/03/2019 2003    Radiological Exams on Admission: DG Tibia/Fibula Right  Result Date: 02/28/2021 CLINICAL DATA:  Infection, evaluate for soft tissue gas EXAM: RIGHT TIBIA AND FIBULA - 2 VIEW COMPARISON:  None. FINDINGS: No fracture or dislocation of the right tibia or fibula. Very extensive soft tissue edema about the lower leg. No subcutaneous gas. IMPRESSION: 1. No fracture or dislocation of the right tibia or fibula. 2. Very extensive soft tissue edema about the lower leg. No subcutaneous gas. Electronically Signed   By: Delanna Ahmadi M.D.   On: 02/28/2021 12:09    EKG: None  Assessment/Plan Principal Problem:   Cellulitis Active Problems:   Cellulitis  in diabetic foot ([place])  (please populate well all problems here in Problem List. (For example, if patient is on BP meds at home and you resume or decide to hold them, it is a problem that needs to be her. 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 -Wound care consulted.  Will discuss with wound care regarding whether surgical intervention/debridement indicated.  X-ray does not show any gas in the soft tissue or foreign body. -Venous Doppler -Echocardiogram rule out right-sided CHF. -Consult case manager for possible home care versus outpatient transportation for wound care. -for pain control, will use Tylenol and dilaudid, no NSAID for sever edema.  Acute on chronic diastolic CHF decompensation -Symptoms signs of fluid overload -Increase Lasix to IV Lasix 60 mg twice daily expect at least 3 to 4 days of aggressive diuresis.  IDDM -We will cut down her 70/30 given expectancy she will eat less in the hospital -Continue sliding scale.  HTN -Hold lisinopril for now given to him Lasix will be increased.  COPD -Stable, continue as needed breathing medications  GERD -Continue PPI and sucralfate  Morbid obesity -Discussed with patient regarding calorie control.  DVT prophylaxis: Lovenox Code Status: Full code Family Communication: None at bedside Disposition Plan: Expect 5 to 7 days hospital stay for aggressive diuresis and IV antibiotic treatment. Consults called: Wound care Admission status: MedSurg admission   Lequita Halt MD [facility] Pager [phone]  02/28/2021, 12:47 PM

## 2021-02-28 NOTE — ED Notes (Addendum)
Pt repositioning in bed and IV caught on pt's hair. IV pulled out at this time. IV team consult placed due to pt being difficult stick.

## 2021-02-28 NOTE — ED Notes (Signed)
IV team at bedside 

## 2021-03-01 ENCOUNTER — Inpatient Hospital Stay
Admit: 2021-03-01 | Discharge: 2021-03-01 | Disposition: A | Discharge status: Home / Self Care | Payer: Medicaid Other | Attending: Internal Medicine | Admitting: Internal Medicine

## 2021-03-01 DIAGNOSIS — E119 Type 2 diabetes mellitus without complications: Secondary | ICD-10-CM

## 2021-03-01 DIAGNOSIS — L03119 Cellulitis of unspecified part of limb: Secondary | ICD-10-CM

## 2021-03-01 DIAGNOSIS — R531 Weakness: Secondary | ICD-10-CM

## 2021-03-01 DIAGNOSIS — I890 Lymphedema, not elsewhere classified: Secondary | ICD-10-CM

## 2021-03-01 DIAGNOSIS — Z6841 Body Mass Index (BMI) 40.0 and over, adult: Secondary | ICD-10-CM

## 2021-03-01 DIAGNOSIS — Z794 Long term (current) use of insulin: Secondary | ICD-10-CM

## 2021-03-01 DIAGNOSIS — S81801A Unspecified open wound, right lower leg, initial encounter: Secondary | ICD-10-CM

## 2021-03-01 DIAGNOSIS — E11628 Type 2 diabetes mellitus with other skin complications: Principal | ICD-10-CM

## 2021-03-01 DIAGNOSIS — I5033 Acute on chronic diastolic (congestive) heart failure: Secondary | ICD-10-CM

## 2021-03-01 LAB — ECHOCARDIOGRAM COMPLETE
AR max vel: 2.34 cm2
AV Area VTI: 2.76 cm2
AV Area mean vel: 2.59 cm2
AV Mean grad: 9 mmHg
AV Peak grad: 16.2 mmHg
Ao pk vel: 2.01 m/s
Area-P 1/2: 5.88 cm2
Height: 66 in
MV VTI: 3.77 cm2
S' Lateral: 3.13 cm
Weight: 5776 oz

## 2021-03-01 LAB — BASIC METABOLIC PANEL
Anion gap: 13 (ref 5–15)
BUN: 19 mg/dL (ref 6–20)
CO2: 19 mmol/L — ABNORMAL LOW (ref 22–32)
Calcium: 9.2 mg/dL (ref 8.9–10.3)
Chloride: 102 mmol/L (ref 98–111)
Creatinine, Ser: 0.71 mg/dL (ref 0.44–1.00)
GFR, Estimated: >60 mL/min (ref >60)
Glucose, Bld: 92 mg/dL (ref 70–99)
Potassium: 4.9 mmol/L (ref 3.5–5.1)
Sodium: 134 mmol/L — ABNORMAL LOW (ref 135–145)

## 2021-03-01 LAB — CBC
HCT: 42.4 % (ref 36.0–46.0)
Hemoglobin: 13.4 g/dL (ref 12.0–15.0)
MCH: 28.7 pg (ref 26.0–34.0)
MCHC: 31.6 g/dL (ref 30.0–36.0)
MCV: 90.8 fL (ref 80.0–100.0)
Platelets: 305 10*3/uL (ref 150–400)
RBC: 4.67 MIL/uL (ref 3.87–5.11)
RDW: 14 % (ref 11.5–15.5)
WBC: 10.9 10*3/uL — ABNORMAL HIGH (ref 4.0–10.5)
nRBC: 0 % (ref 0.0–0.2)

## 2021-03-01 LAB — GLUCOSE, CAPILLARY
Glucose-Capillary: 100 mg/dL — ABNORMAL HIGH (ref 70–99)
Glucose-Capillary: 107 mg/dL — ABNORMAL HIGH (ref 70–99)
Glucose-Capillary: 122 mg/dL — ABNORMAL HIGH (ref 70–99)
Glucose-Capillary: 126 mg/dL — ABNORMAL HIGH (ref 70–99)

## 2021-03-01 MED ORDER — INSULIN GLARGINE-YFGN 100 UNIT/ML ~~LOC~~ SOLN
27.0000 [IU] | Freq: Every day | SUBCUTANEOUS | Status: DC
  Administered 2021-03-01 – 2021-03-05 (×5): 27 [IU] via SUBCUTANEOUS
  Filled 2021-03-01 (×6): qty 0.27

## 2021-03-01 MED ORDER — VANCOMYCIN HCL 1500 MG/300ML IV SOLN
1500.0000 mg | Freq: Two times a day (BID) | INTRAVENOUS | Status: AC
  Administered 2021-03-01: 1500 mg via INTRAVENOUS
  Filled 2021-03-01 (×2): qty 300

## 2021-03-01 MED ORDER — POTASSIUM CHLORIDE 20 MEQ PO PACK
20.0000 meq | PACK | Freq: Two times a day (BID) | ORAL | Status: DC
  Administered 2021-03-01 – 2021-03-05 (×10): 20 meq via ORAL
  Filled 2021-03-01 (×10): qty 1

## 2021-03-01 MED ORDER — OXYCODONE HCL 5 MG PO TABS
10.0000 mg | ORAL_TABLET | Freq: Four times a day (QID) | ORAL | Status: DC | PRN
  Administered 2021-03-02 – 2021-03-06 (×12): 10 mg via ORAL
  Filled 2021-03-01 (×12): qty 2

## 2021-03-01 MED ORDER — SODIUM CHLORIDE 0.9 % IV SOLN
3.0000 g | Freq: Four times a day (QID) | INTRAVENOUS | Status: DC
  Administered 2021-03-02 – 2021-03-06 (×17): 3 g via INTRAVENOUS
  Filled 2021-03-01: qty 8
  Filled 2021-03-01: qty 3
  Filled 2021-03-01 (×2): qty 8
  Filled 2021-03-01: qty 3
  Filled 2021-03-01 (×3): qty 8
  Filled 2021-03-01: qty 3
  Filled 2021-03-01 (×4): qty 8
  Filled 2021-03-01 (×2): qty 3
  Filled 2021-03-01 (×2): qty 8
  Filled 2021-03-01: qty 3
  Filled 2021-03-01 (×2): qty 8

## 2021-03-01 NOTE — Assessment & Plan Note (Signed)
Patient usually only takes Tylenol and ibuprofen for this.

## 2021-03-01 NOTE — Assessment & Plan Note (Addendum)
Continue Unasyn.  Infectious disease added Zyvox last night.  Dr. Steva Ready will call the lab and see what the sensitivities are on the Staph aureus and adjust antibiotics.  Wound culture now showing some Staph aureus.  Will be unable to set up home health and will have to follow-up at the wound care center.  Right lower extremity wounds will have to be covered until healed.

## 2021-03-01 NOTE — Consult Note (Signed)
NAME: Paula Gilmore  DOB: 03-05-1970  MRN: [id number]  Date/Time: 03/01/2021 1:12 PM  REQUESTING PROVIDER: Dr. Leslye Peer Subjective:  REASON FOR CONSULT: Cellulitis right leg  Paula Gilmore is a 51 y.o. female with a history of diabetes mellitus, hypertension, chronic lymphedema on Lasix, cardiomyopathy, morbid obesity, COPD with chronic right lower extremity wounds presented with worsening pain/ discharge of the right leg wound on 2/ 2/23. Patient has had  right lower extremity superficial wound since summer.  She has been doing topical care at home.  But of late she has noticed increasing size of the wound and some discharge and hence was planning to go to her PCP but could not reach because of transportation.  So she called EMS and was brought into the hospital.  Patient has been taking 20 mg Lasix every day. In the ED BP 132/66, temperature 98.4, pulse 89, respiratory rate 20 and sats 96%. WBC was 12.5, Hb 13.7, platelet 327 and creatinine 0.92.  X-ray of the leg did not show any subcutaneous gas.  There was extensive edema.  Ultrasound did not show any DVT.  Blood culture sent.  And she was started on vancomycin and cefepime. I am asked to see the patient for the same.    Past Medical History:  Diagnosis Date   Acid reflux    Arthritis    Cancer (HCC)    Cervical   Cardiomyopathy (HCC)    COPD (chronic obstructive pulmonary disease) (HCC)    Fibromyalgia    Hypertension    Migraine    Mitral valve regurgitation     Past Surgical History:  Procedure Laterality Date   CESAREAN SECTION  2009    Social History   Socioeconomic History   Marital status: Divorced    Spouse name: Not on file   Number of children: 1   Years of education: Not on file   Highest education level: Not on file  Occupational History   Not on file  Tobacco Use   Smoking status: Former   Smokeless tobacco: Never  Vaping Use   Vaping Use: Never used  Substance and Sexual Activity    Alcohol use: No   Drug use: No   Sexual activity: Not on file  Other Topics Concern   Not on file  Social History Narrative   Patient recently moved here from [place] and is trying to become established with providers in the area.    Social Determinants of Health   Financial Resource Strain: Not on file  Food Insecurity: Not on file  Transportation Needs: Not on file  Physical Activity: Not on file  Stress: Not on file  Social Connections: Not on file  Intimate Partner Violence: Not on file    History reviewed. No pertinent family history. Allergies  Allergen Reactions   Duloxetine Palpitations    Causes V-Tach-per Patient    Milnacipran Palpitations    Causes V-Tach per patient    Cymbalta [Duloxetine Hcl] Other (See Comments)    V-tach   Montelukast Hives   Tizanidine Hives   Savella [Milnacipran Hcl] Palpitations   I? Current Facility-Administered Medications  Medication Dose Route Frequency Provider Last Rate Last Admin   acetaminophen (TYLENOL) tablet 1,000 mg  1,000 mg Oral Q6H PRN Wynetta Fines T, MD       acidophilus (RISAQUAD) capsule 2 capsule  2 capsule Oral TID Wynetta Fines T, MD   2 capsule at 03/01/21 1032   albuterol (PROVENTIL) (2.5 MG/3ML) 0.083% nebulizer solution  2.5 mg  2.5 mg Nebulization Q6H PRN Wynetta Fines T, MD       ALPRAZolam Duanne Moron) tablet 0.25 mg  0.25 mg Oral TID PRN Wynetta Fines T, MD   0.25 mg at 03/01/21 1037   aspirin EC tablet 81 mg  81 mg Oral Daily Wynetta Fines T, MD   81 mg at 03/01/21 1033   calcium carbonate (TUMS - dosed in mg elemental calcium) chewable tablet 200 mg of elemental calcium  1 tablet Oral QID PRN Wynetta Fines T, MD       ceFEPIme (MAXIPIME) 2 g in sodium chloride 0.9 % 100 mL IVPB  2 g Intravenous Q8H Rauer, Samantha O, RPH 200 mL/hr at 03/01/21 0608 2 g at 03/01/21 5883   dicyclomine (BENTYL) capsule 10 mg  10 mg Oral QID PRN Lequita Halt, MD       enoxaparin (LOVENOX) injection 82.5 mg  0.5 mg/kg Subcutaneous Q24H [place],  Ping T, MD   82.5 mg at 02/28/21 2125   fluticasone (FLONASE) 50 MCG/ACT nasal spray 2 spray  2 spray Each Nare Daily PRN Lequita Halt, MD       furosemide (LASIX) injection 60 mg  60 mg Intravenous BID Wynetta Fines T, MD   60 mg at 03/01/21 0900   HYDROmorphone (DILAUDID) injection 0.5 mg  0.5 mg Intravenous Q4H PRN Wynetta Fines T, MD   0.5 mg at 03/01/21 1037   insulin aspart (novoLOG) injection 0-20 Units  0-20 Units Subcutaneous TID WC Wynetta Fines T, MD       insulin aspart (novoLOG) injection 0-5 Units  0-5 Units Subcutaneous QHS Wynetta Fines T, MD       insulin glargine-yfgn [facility]) injection 35 Units  35 Units Subcutaneous QHS Wynetta Fines T, MD   35 Units at 02/28/21 2213   ipratropium-albuterol (DUONEB) 0.5-2.5 (3) MG/3ML nebulizer solution 3 mL  3 mL Nebulization Q6H PRN Wynetta Fines T, MD       loratadine (CLARITIN) tablet 10 mg  10 mg Oral Daily Wynetta Fines T, MD   10 mg at 03/01/21 1033   ondansetron (ZOFRAN-ODT) disintegrating tablet 4 mg  4 mg Oral Q6H PRN Wynetta Fines T, MD       oxyCODONE (Oxy IR/ROXICODONE) immediate release tablet 10 mg  10 mg Oral Q6H PRN Loletha Grayer, MD       pantoprazole (PROTONIX) EC tablet 40 mg  40 mg Oral Daily Wynetta Fines T, MD   40 mg at 03/01/21 0900   sucralfate (CARAFATE) 1 GM/10ML suspension 1 g  1 g Oral TID PC & HS Wynetta Fines T, MD   1 g at 03/01/21 0900   vancomycin (VANCOREADY) IVPB 1250 mg/250 mL  1,250 mg Intravenous Q12H Rauer, Forde Dandy, RPH 166.7 mL/hr at 03/01/21 0427 1,250 mg at 03/01/21 0427     Abtx:  Anti-infectives (From admission, onward)    Start     Dose/Rate Route Frequency Ordered Stop   03/01/21 0400  vancomycin (VANCOREADY) IVPB 1250 mg/250 mL        1,250 mg 166.7 mL/hr over 90 Minutes Intravenous Every 12 hours 02/28/21 1449 03/07/21 0359   02/28/21 2000  ceFEPIme (MAXIPIME) 2 g in sodium chloride 0.9 % 100 mL IVPB        2 g 200 mL/hr over 30 Minutes Intravenous Every 8 hours 02/28/21 1312     02/28/21 1315   vancomycin (VANCOREADY) IVPB 1500 mg/300 mL        1,500 mg  150 mL/hr over 120 Minutes Intravenous  Once 02/28/21 1307 02/28/21 2013   02/28/21 1045  ceFEPIme (MAXIPIME) 2 g in sodium chloride 0.9 % 100 mL IVPB        2 g 200 mL/hr over 30 Minutes Intravenous  Once 02/28/21 1031 02/28/21 1136   02/28/21 1045  vancomycin (VANCOCIN) IVPB 1000 mg/200 mL premix        1,000 mg 200 mL/hr over 60 Minutes Intravenous  Once 02/28/21 1031 02/28/21 1632       REVIEW OF SYSTEMS:  Const: negative fever, negative chills, negative weight loss Eyes: negative diplopia or visual changes, negative eye pain ENT: negative coryza, negative sore throat Resp: negative cough, hemoptysis, has dyspnea on exertion Cards: No chest pain.  Shortness of breath on exertion Edema legs  GU: negative for frequency, dysuria and hematuria GI: Negative for abdominal pain, diarrhea, bleeding, constipation Skin: negative for rash and pruritus Heme: negative for easy bruising and gum/nose bleeding MS: Poor mobility Neurolo:negative for headaches, dizziness, vertigo, memory problems  Psych: Anxiety  endocrine:, diabetes Allergy/Immunology-as above Objective:  VITALS:  BP (!) 141/78 (BP Location: Left Arm)    Pulse 89    Temp 98.1 F (36.7 C)    Resp 20    Ht 5\' 6"  (1.676 m)    Wt (!) 163.7 kg    SpO2 92%    BMI 58.27 kg/m  PHYSICAL EXAM:  General: Alert, cooperative, extremely obese BMI 58 Head: Normocephalic, without obvious abnormality, atraumatic. Eyes: Conjunctivae clear, anicteric sclerae. Pupils are equal ENT Nares normal. No drainage or sinus tenderness. Lips, mucosa, and tongue normal. No Thrush Neck: Supple, symmetrical, no adenopathy, thyroid: non tender no carotid bruit and no JVD. Back: No CVA tenderness. Lungs: Bilateral air entry Heart: Regular rate and rhythm, no murmur, rub or gallop. Abdomen: Soft, pannus Extremities: 5 lateral edema legs Superficial wound on the right calf area   Skin: No  rashes or lesions. Or bruising Lymph: Cervical, supraclavicular normal. Neurologic: Grossly non-focal Pertinent Labs Lab Results CBC    Component Value Date/Time   WBC 10.9 (H) 03/01/2021 0911   RBC 4.67 03/01/2021 0911   HGB 13.4 03/01/2021 0911   HCT 42.4 03/01/2021 0911   PLT 305 03/01/2021 0911   MCV 90.8 03/01/2021 0911   MCH 28.7 03/01/2021 0911   MCHC 31.6 03/01/2021 0911   RDW 14.0 03/01/2021 0911   LYMPHSABS 2.0 02/28/2021 1039   MONOABS 0.9 02/28/2021 1039   EOSABS 0.3 02/28/2021 1039   BASOSABS 0.1 02/28/2021 1039    CMP Latest Ref Rng & Units 03/01/2021 02/28/2021 11/07/2019  Glucose 70 - 99 mg/dL 92 136(H) 332(H)  BUN 6 - 20 mg/dL 19 18 7   Creatinine 0.44 - 1.00 mg/dL 0.71 0.92 0.63  Sodium 135 - 145 mmol/L 134(L) 137 131(L)  Potassium 3.5 - 5.1 mmol/L 4.9 4.5 3.9  Chloride 98 - 111 mmol/L 102 104 99  CO2 22 - 32 mmol/L 19(L) 24 23  Calcium 8.9 - 10.3 mg/dL 9.2 9.0 8.5(L)  Total Protein 6.5 - 8.1 g/dL - 7.5 6.4(L)  Total Bilirubin 0.3 - 1.2 mg/dL - 0.6 0.9  Alkaline Phos 38 - 126 U/L - 79 67  AST 15 - 41 U/L - 23 42(H)  ALT 0 - 44 U/L - 27 32      Microbiology: Recent Results (from the past 240 hour(s))  Culture, blood (Routine X 2) w Reflex to ID Panel     Status: None (Preliminary result)   Collection  Time: 02/28/21 10:40 AM   Specimen: BLOOD  Result Value Ref Range Status   Specimen Description BLOOD RIGHT HAND  Final   Special Requests   Final    BOTTLES DRAWN AEROBIC AND ANAEROBIC Blood Culture results may not be optimal due to an inadequate volume of blood received in culture bottles   Culture   Final    NO GROWTH < 24 HOURS Performed at [facility], [address] [place], [place] [address]    Report Status PENDING  Incomplete  Culture, blood (Routine X 2) w Reflex to ID Panel     Status: None (Preliminary result)   Collection Time: 02/28/21 11:40 AM   Specimen: BLOOD  Result Value Ref Range Status   Specimen Description BLOOD  RIGHT HAND  Final   Special Requests   Final    BOTTLES DRAWN AEROBIC AND ANAEROBIC Blood Culture results may not be optimal due to an inadequate volume of blood received in culture bottles   Culture   Final    NO GROWTH < 24 HOURS Performed at [facility], [address], [place], [place] [address]    Report Status PENDING  Incomplete  Resp Panel by RT-PCR (Flu A&B, Covid) Nasopharyngeal Swab     Status: None   Collection Time: 02/28/21  3:00 PM   Specimen: Nasopharyngeal Swab; Nasopharyngeal(NP) swabs in vial transport medium  Result Value Ref Range Status   SARS Coronavirus 2 by RT PCR NEGATIVE NEGATIVE Final    Comment: (NOTE) SARS-CoV-2 target nucleic acids are NOT DETECTED.  The SARS-CoV-2 RNA is generally detectable in upper respiratory specimens during the acute phase of infection. The lowest concentration of SARS-CoV-2 viral copies this assay can detect is 138 copies/mL. A negative result does not preclude SARS-Cov-2 infection and should not be used as the sole basis for treatment or other patient management decisions. A negative result may occur with  improper specimen collection/handling, submission of specimen other than nasopharyngeal swab, presence of viral mutation(s) within the areas targeted by this assay, and inadequate number of viral copies(<138 copies/mL). A negative result must be combined with clinical observations, patient history, and epidemiological information. The expected result is Negative.  Fact Sheet for Patients:  EntrepreneurPulse.com.au  Fact Sheet for Healthcare Providers:  IncredibleEmployment.be  This test is no t yet approved or cleared by the [place] FDA and  has been authorized for detection and/or diagnosis of SARS-CoV-2 by FDA under an Emergency Use Authorization (EUA). This EUA will remain  in effect (meaning this test can be used) for the duration of the COVID-19 declaration under  Section 564(b)(1) of the Act, 21 U.S.C.section 360bbb-3(b)(1), unless the authorization is terminated  or revoked sooner.       Influenza A by PCR NEGATIVE NEGATIVE Final   Influenza B by PCR NEGATIVE NEGATIVE Final    Comment: (NOTE) The Xpert Xpress SARS-CoV-2/FLU/RSV plus assay is intended as an aid in the diagnosis of influenza from Nasopharyngeal swab specimens and should not be used as a sole basis for treatment. Nasal washings and aspirates are unacceptable for Xpert Xpress SARS-CoV-2/FLU/RSV testing.  Fact Sheet for Patients: EntrepreneurPulse.com.au  Fact Sheet for Healthcare Providers: IncredibleEmployment.be  This test is not yet approved or cleared by the [place] FDA and has been authorized for detection and/or diagnosis of SARS-CoV-2 by FDA under an Emergency Use Authorization (EUA). This EUA will remain in effect (meaning this test can be used) for the duration of the COVID-19 declaration under Section 564(b)(1)  of the Act, 21 U.S.C. section 360bbb-3(b)(1), unless the authorization is terminated or revoked.  Performed at [facility], [address] [place], [place] [address]     IMAGING RESULTS: I have personally reviewed the films ? Impression/Recommendation ? Chronic lymphedema both legs Right leg wound Superficial ulceration Some scabbing Seen and ordered position May need biopsy Currently on Vanco and cefepime Took a culture of the wound Will change antibiotics to Unasyn  ?  Diabetes mellitus on sliding scale  Morbid obesity  Discussed the management with the patient.  We will follow her peripherally this weekend.  Call if needed. ___________________________________________________ Discussed with patient, requesting provider Note:  This document was prepared using Dragon voice recognition software and may include unintentional dictation errors.

## 2021-03-01 NOTE — Progress Notes (Signed)
*  PRELIMINARY RESULTS* Echocardiogram 2D Echocardiogram has been performed.  Paula Gilmore 03/01/2021, 11:02 AM

## 2021-03-01 NOTE — Progress Notes (Signed)
Patient sitting on side of bed. Discussion had with patient about her concerns. Patient is tearful and continues to c/o pain to her right leg. Patient currently has right leg down on floor. She states that this is more comfortable to her than having both legs up in the bed. Wound noted to back of right leg. 2+ edema noted to bilateral lower extremity. Redness also noted to right lower leg. Educated patient to possibly elevate her leg. Patient refuses. Continues to sit on side of bed. Patient is very emotional and tearful. This nurse ask would she like to speak with a priest. She states "sure, but not tonight" Will place order in for a chaplain consult. IV vancomycin infusing at present. Takes pills whole. Answers questions appropriately but then she begins to ramble about other things. She says that her son is 46 and staying with her mom who is currently dying. She becomes tearful over this. She says that she has to order groceries so that her family can eat otherwise they wont. Patient admit to feeling quite overwhelmed at this time. This nurse sat with patient and continue to listen to her concerns. She thanks nurse. Pain and anxiety medicated with some success. Advised patient to lay back In the bed because I noticed she began snoring and falling asleep as I was hanging her other antibiotic. Patient begins to cry again and says "please don't make me" This nurse continue to advise patient of safety precautions. Patient changes position. She remains sitting on side of bed but lays across bed on her back with right leg on side of bed with foot on floor. Will continue to observe. Call light within reach. Reminded to push light when help is needed. This nurse assisted patient to [facility]. Patient is very weak and just needing prompting due to her IV. Belongings within her reach. Bed in lowest position.

## 2021-03-01 NOTE — Progress Notes (Addendum)
The patient is from home, She has been having trouble with he [facility] transport getting to the doctor, She has Medicaid and is in a wheelchair She lives at home with her minor son, he has been wrapping her leg at home, She needs wound care and has not went to outpatient wound care  I reached out to [employer], [facility], [facility], [place], [place], [place], [place], [place], [place], [place], Requesting to accept the patient for [employer], [facility] does not have staffing, [facility], [facility], [place], [facility], [place], and [place]  and [place] is unable to accept the patient The patient will likely need to go to outpatient wound care TOC to continue to assist

## 2021-03-01 NOTE — Progress Notes (Incomplete Revision)
Patient sitting on side of bed. Discussion had with patient about her concerns. Patient is tearful and continues to c/o pain to her right leg. Patient currently has right leg down on floor. She states that this is more comfortable to her than having both legs up in the bed. Wound noted to back of right leg. 2+ edema noted to bilateral lower extremity. Redness also noted to right lower leg. Educated patient to possibly elevate her leg. Patient refuses. Continues to sit on side of bed. Patient is very emotional and tearful. This nurse ask would she like to speak with a priest. She states "sure, but not tonight" Will place order in for a chaplain consult. IV vancomycin infusing at present. Takes pills whole. Answers questions appropriately but then she begins to ramble about other things. She says that her son is 90 and staying with her mom who is currently dying. She becomes tearful over this. She says that she has to order groceries so that her family can eat otherwise they wont. Patient admit to feeling quite overwhelmed at this time. This nurse sat with patient and continue to listen to her concerns. She thanks nurse. Pain and anxiety medicated with some success. Advised patient to lay back In the bed because I noticed she began snoring and falling asleep as I was hanging her other antibiotic. Patient begins to cry again and says "please don't make me" This nurse continue to advise patient of safety precautions. Patient changes position. She remains sitting on side of bed but lays across bed on her back with right leg on side of bed with foot on floor. Will continue to observe. Call light within reach. Reminded to push light when help is needed. This nurse assisted patient to [facility]. Patient is very weak and just needing prompting due to her IV. Belongings within her reach. Bed in lowest position.   0400- Patient noted to be crying out while in bed. Patient noted to be holding her head. She has rambling speech and  crying. I ask what is wrong, and how can I help her. She says her leg is in pain and a headache woke her up. Prn pain med given.   3532-

## 2021-03-01 NOTE — Assessment & Plan Note (Addendum)
BMI 58.29 on previous BMI.  Today's BMI of 63.77 and is likely falsely high because it was a bed weight.  I asked the nursing staff to recheck her weight on a standing scale.

## 2021-03-01 NOTE — Assessment & Plan Note (Addendum)
Patient also has bilateral lymphedema and weeping right lower extremity edema.  Continue Lasix 60 mg IV twice a day.  Ejection fraction 60 to 65%.  Weeping edema seems less today.

## 2021-03-01 NOTE — Progress Notes (Signed)
°  Progress Note   Patient: Paula Gilmore TTS:[id number] DOB: Mar 24, 1970 DOA: 02/28/2021     1 DOS: the patient was seen and examined on 03/01/2021    Assessment and Plan: * Cellulitis in diabetic foot [facility]) Admitting physician placed on vancomycin and cefepime.  Will consult infectious disease.  Extensive soft tissue infection seen on x-ray.  Pain control with oral and IV meds.  Obtain wound culture.  Acute on chronic diastolic CHF (congestive heart failure) ([place]) Patient also has bilateral lymphedema.  Continue Lasix 60 mg IV twice a day.  Ejection fraction normal on echocardiogram  Type 2 diabetes mellitus (HCC) Hemoglobin A1c pending.  Patient on Semglee insulin 35 units daily and sliding scale insulin.  Last 2 sugars well controlled at 100, and 107.  Decrease Semglee insulin down to 27 units at night  Morbid obesity with BMI of 50.0-59.9, adult (HCC) BMI 58.27.  Watch with diuresis.  Fibromyalgia Patient usually only takes Tylenol and ibuprofen for this.  Weakness Physical therapy evaluation.        Subjective: Patient is in a lot of pain 10 out of 10 in intensity.  I advised her to take the oral medications which will stick around longer than the IV.  Increase the dose of the oxycodone.  Patient admitted with diabetic foot infection.  Physical Exam: Vitals:   02/28/21 1942 03/01/21 0433 03/01/21 0814 03/01/21 1219  BP: 132/66 138/71 138/88 (!) 141/78  Pulse: 89 85 88 89  Resp:  18 20 20   Temp:  98.4 F (36.9 C) 98.4 F (36.9 C) 98.1 F (36.7 C)  TempSrc:      SpO2: 96% 94% 100% 92%  Weight:      Height:       Physical Exam HENT:     Head: Normocephalic.     Mouth/Throat:     Pharynx: No oropharyngeal exudate.  Eyes:     General: Lids are normal.     Conjunctiva/sclera: Conjunctivae normal.  Cardiovascular:     Rate and Rhythm: Normal rate and regular rhythm.     Heart sounds: Normal heart sounds, S1 normal and S2 normal.  Pulmonary:     Breath  sounds: Normal breath sounds. No decreased breath sounds, wheezing, rhonchi or rales.  Abdominal:     Palpations: Abdomen is soft.     Tenderness: There is no abdominal tenderness.  Musculoskeletal:     Right lower leg: Swelling present.     Left lower leg: Swelling present.  Skin:    General: Skin is warm.     Comments: Reviewed the pictures on her phone of her leg. Erythema extending up her leg over where the dressing is.  Neurological:     Mental Status: She is alert and oriented to person, place, and time.     Data Reviewed: Laboratory and radiological data reviewed by me.  Echocardiogram reviewed and normal EF.  Family Communication: Declined  Disposition: Status is: Inpatient Remains inpatient appropriate because: Likely will require few days of IV antibiotics before leg starts to improve  Planned Discharge Destination: Home  Author: Loletha Grayer, MD 03/01/2021 2:14 PM  For on call review [email or web address].

## 2021-03-01 NOTE — Assessment & Plan Note (Addendum)
Hemoglobin A1c 6.7.  Continue Semglee insulin 27 units at night.  Continue sliding scale insulin.  Last four sugars are 155, 142, 124 and 157

## 2021-03-01 NOTE — Assessment & Plan Note (Addendum)
Physical therapy evaluation appreciated

## 2021-03-01 NOTE — Progress Notes (Signed)
Pharmacy Antibiotic Note  Paula Gilmore is a 51 y.o. female admitted on 02/28/2021 with cellulitis. Pharmacy has been consulted for vancomycin and cefepime dosing.  Plan: Pt received vancomycin 2.5 g IV loading dose Will increase vancomycin dose to 1500 mg IV q12h for improved renal function  Est AUC: 533.1 Used: Scr 0.8, IBW, Vd 0.5   Continue cefepime 2 g IV q8h  Monitor clinical picture, renal function, check vancomycin levels around 4th or 5th dose if continued F/U C&S, abx deescalation / LOT   Height: 5\' 6"  (167.6 cm) Weight: (!) 163.7 kg (361 lb) IBW/kg (Calculated) : 59.3  Temp (24hrs), Avg:98.3 F (36.8 C), Min:98.1 F (36.7 C), Max:98.4 F (36.9 C)  Recent Labs  Lab 02/28/21 1039 02/28/21 1858 02/28/21 2226 03/01/21 0703 03/01/21 0911  WBC 12.5*  --   --   --  10.9*  CREATININE 0.92  --   --  0.71  --   LATICACIDVEN 1.8 2.1* 1.1  --   --      Estimated Creatinine Clearance: 132.8 mL/min (by C-G formula based on SCr of 0.71 mg/dL).    Allergies  Allergen Reactions   Duloxetine Palpitations    Causes V-Tach-per Patient    Milnacipran Palpitations    Causes V-Tach per patient    Cymbalta [Duloxetine Hcl] Other (See Comments)    V-tach   Montelukast Hives   Tizanidine Hives   Savella [Milnacipran Hcl] Palpitations    Antimicrobials this admission: 2/2 cefepime >>  2/2 vancomycin >>   Microbiology results: 2/2 BCx: sent  Thank you for allowing pharmacy to be a part of this patients care.  Darnelle Bos, PharmD 03/01/2021 2:35 PM

## 2021-03-02 DIAGNOSIS — M797 Fibromyalgia: Secondary | ICD-10-CM

## 2021-03-02 DIAGNOSIS — K5900 Constipation, unspecified: Secondary | ICD-10-CM

## 2021-03-02 LAB — GLUCOSE, CAPILLARY
Glucose-Capillary: 141 mg/dL — ABNORMAL HIGH (ref 70–99)
Glucose-Capillary: 118 mg/dL — ABNORMAL HIGH (ref 70–99)
Glucose-Capillary: 150 mg/dL — ABNORMAL HIGH (ref 70–99)
Glucose-Capillary: 164 mg/dL — ABNORMAL HIGH (ref 70–99)

## 2021-03-02 LAB — HEMOGLOBIN A1C
Hgb A1c MFr Bld: 6.7 % — ABNORMAL HIGH (ref 4.8–5.6)
Mean Plasma Glucose: 146 mg/dL

## 2021-03-02 MED ORDER — SODIUM CHLORIDE 0.9 % IV SOLN
INTRAVENOUS | Status: DC | PRN
  Administered 2021-03-02: 10 mL/h via INTRAVENOUS

## 2021-03-02 MED ORDER — POLYETHYLENE GLYCOL 3350 17 G PO PACK
17.0000 g | PACK | Freq: Every day | ORAL | Status: DC
  Administered 2021-03-02 – 2021-03-06 (×5): 17 g via ORAL
  Filled 2021-03-02 (×5): qty 1

## 2021-03-02 NOTE — Progress Notes (Signed)
°  Progress Note   Patient: Paula Gilmore FAO:[id number] DOB: Sep 04, 1970 DOA: 02/28/2021     2 DOS: the patient was seen and examined on 03/02/2021     Assessment and Plan: * Cellulitis in diabetic foot ([place]) Wound culture not growing any organisms.  Picture from today looks a little bit better than yesterday.  Continue Unasyn.  Acute on chronic diastolic CHF (congestive heart failure) ([place]) Patient also has bilateral lymphedema and weeping right lower extremity edema.  Continue Lasix 60 mg IV twice a day.  Ejection fraction 60 to 65%.  Type 2 diabetes mellitus (HCC) Hemoglobin A1c pending.  Continue Semglee insulin 27 units at night.  Continue sliding scale insulin  Morbid obesity with BMI of 50.0-59.9, adult (HCC) BMI 58.27.  Watch with diuresis.  Will need daily weights on standing scale.  Fibromyalgia Patient usually only takes Tylenol and ibuprofen for this.  Constipation Add MiraLAX  Weakness Physical therapy evaluation.        Subjective: Patient still having a lot of pain in her right lower extremity.  She graded it 12 out of 10 in intensity.  Patient having some weeping edema right lower extremity and redness.  Physical Exam: Vitals:   03/01/21 2149 03/02/21 0425 03/02/21 0817 03/02/21 1143  BP: 139/70 136/90 (!) 117/99 (!) 147/99  Pulse: 78 92 91 85  Resp: 20 17 18 18   Temp: (!) 97.4 F (36.3 C) 98.1 F (36.7 C) 98.4 F (36.9 C) 98.2 F (36.8 C)  TempSrc:      SpO2: 92% 98% 90% 95%  Weight:      Height:       Physical Exam HENT:     Head: Normocephalic.     Mouth/Throat:     Pharynx: No oropharyngeal exudate.  Eyes:     General: Lids are normal.     Conjunctiva/sclera: Conjunctivae normal.  Cardiovascular:     Rate and Rhythm: Normal rate and regular rhythm.     Heart sounds: Normal heart sounds, S1 normal and S2 normal.  Pulmonary:     Breath sounds: Normal breath sounds. No decreased breath sounds, wheezing, rhonchi or rales.   Abdominal:     Palpations: Abdomen is soft.     Tenderness: There is no abdominal tenderness.  Musculoskeletal:     Right lower leg: Swelling present.     Left lower leg: Swelling present.  Skin:    General: Skin is warm.     Comments: See pictures below Weeping edema right lower extremity  Neurological:     Mental Status: She is alert and oriented to person, place, and time.        Data Reviewed: Sugars reviewed last for sugars 122, 126, 141 and 118.  White blood cell count today down to 10.9.  Family Communication: Declined  Disposition: Status is: Inpatient Remains inpatient appropriate because: Being treated for extensive right lower extremity cellulitis  Planned Discharge Destination: Home  Author: Loletha Grayer, MD 03/02/2021 12:38 PM  For on call review [email or web address].

## 2021-03-02 NOTE — Evaluation (Signed)
Physical Therapy Evaluation Patient Details Name: Paula Gilmore MRN: [id number] DOB: [birth year] Today's Date: 03/02/2021  History of Present Illness  Paula Gilmore is a 51 y.o. 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 Wounds developed in summertime and have gradually worsened; She has been unable to see wound care or podiatry for different reasons including transportation issues. Has never been treated with antibiotics for wounds; Has a sedentary life limited with lymphedema; Pt admitted and given IV antibiotics; Diagnosed with cellulitis of RLE;  Clinical Impression  51 yo Female presents to ED with large wound on RLE that has not been healing. She reports she came to ED to get care for her wound and to obtain a wheelchair so that she can get to her medical appointments easier. Patient is currently not walking and is limited to sit<>Stand/stand pivot transfers. Her leg pain as well as edema and LE weakness limit her mobility. She states every time she tries to walk her wound worsens and therefore she has been mostly bed bound. 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 She currently sponge bathes and uses a bedside commode. She reports doing some of the cooking, using pre-prepped meals. Patient lives in a single story home but has 5-6 steps to enter. She is hoping her landlord will make adjustments so that she can get the wheelchair in/out of the home. Until then, she states her son can carry the wheelchair in/out of the house. She will require a bariatric wheelchair to accommodate her size. PT suggested inpatient rehab so that patient could receive 24/7 care. Patient refused stating she needs to get home to care for her son. She  would benefit from skilled PT Intervention while in acute care to address weakness/ROM deficits and to educate patient in proper positioning to help reduce edema and improve mobility. Patient does not feel comfortable with home health services and therefore would benefit from outpatient PT referral to address additional mobility deficits. Consider referral for lymphedema management. Patient will also need assistance for transportation needs;      Recommendations for follow up therapy are one component of a multi-disciplinary discharge planning process, led by the attending physician.  Recommendations may be updated based on patient status, additional functional criteria and insurance authorization.  Follow Up Recommendations Outpatient PT    Assistance Recommended at Discharge Intermittent Supervision/Assistance  Patient can return home with the following  A little help with walking and/or transfers;A little help with bathing/dressing/bathroom;Assistance with cooking/housework;Assist for transportation;Help with stairs or ramp for entrance    Equipment Recommendations Other (comment) (would benefit from bariatric wheelchair)  Recommendations for Other Services       Functional Status Assessment Patient has had a recent decline in their functional status and/or demonstrates limited ability to make significant improvements in function in a reasonable and predictable amount of time     Precautions / Restrictions Precautions Precautions: Fall Restrictions Weight Bearing Restrictions: No      Mobility  Bed Mobility Overal bed mobility: Modified Independent             General bed mobility comments: requires bed rail and elevated head of bed; able to lift LE in/out of bed mod I, requires increased time/effort;    Transfers Overall transfer  level: Modified independent Equipment used: Rolling walker (2 wheels)               General transfer comment: Requires increased  time/effort but able to stand with supervision and good safety awareness/positioning;    Ambulation/Gait Ambulation/Gait assistance:  (unable)             General Gait Details: unable due to weakness/pain in LE from wound  Stairs            Wheelchair Mobility    Modified Rankin (Stroke Patients Only)       Balance Overall balance assessment: Mild deficits observed, not formally tested (pt able to stand with RW support, supervision)                                           Pertinent Vitals/Pain Pain Assessment Pain Assessment: 0-10 Pain Score: 4  Pain Location: RLE lower leg Pain Descriptors / Indicators: Aching, Sore Pain Intervention(s): Limited activity within patient's tolerance, Monitored during session, Repositioned    Home Living Family/patient expects to be discharged to:: Private residence Living Arrangements: Children (54 yo) Available Help at Discharge: Family;Available 24 hours/day (son homeschools) Type of Home: House Home Access: Stairs to enter Entrance Stairs-Rails: Right;Left;Can reach both Entrance Stairs-Number of Steps: 5-6   Home Layout: One level Home Equipment: Conservation officer, nature (2 wheels);BSC/3in1 (walking stick)      Prior Function Prior Level of Function : Independent/Modified Independent             Mobility Comments: limited to mostly bed bound, would transfer bed to chair, minimal walking; had bath/toilet set up near bed ADLs Comments: does minimal cooking/uses microwave     Hand Dominance   Dominant Hand: Right    Extremity/Trunk Assessment   Upper Extremity Assessment Upper Extremity Assessment: Overall WFL for tasks assessed    Lower Extremity Assessment Lower Extremity Assessment: Overall WFL for tasks assessed;Generalized weakness (not formally assessed; able to stand with supervision)       Communication   Communication: No difficulties  Cognition Arousal/Alertness:  Awake/alert Behavior During Therapy: WFL for tasks assessed/performed Overall Cognitive Status: Within Functional Limits for tasks assessed                                 General Comments: patient oriented to person, place and situation; She does get distracted and has long answers/stories        General Comments General comments (skin integrity, edema, etc.): exhibits edema in BLE (R>L) with large open wound on posterior RLE that is weeping/draining; dressing applied during PT evaluation by RN    Exercises Other Exercises Other Exercises: Patient prefers to have RLE resting off bed due to draining and not wanting to get sheets/bed wet. PT educated patient in importance of having LE elevated to help reduce swelling and lower lymphedema. Patient verbalized understanding and states she will change positions more often; Also recommended patient move LE regularly including LAQ and ankle pump; patient verbalized understanding;   Assessment/Plan    PT Assessment Patient needs continued PT services  PT Problem List Decreased strength;Decreased mobility;Decreased activity tolerance;Pain;Decreased skin integrity;Obesity;Decreased range of motion;Decreased safety awareness       PT Treatment Interventions DME instruction;Therapeutic exercise;Gait training;Balance training;Stair training;Neuromuscular re-education;Functional mobility training;Therapeutic activities;Patient/family education;Wheelchair mobility training    PT Goals (Current  goals can be found in the Care Plan section)  Acute Rehab PT Goals Patient Stated Goal: to go home and get a bariatric wheelchair for mobility PT Goal Formulation: With patient Time For Goal Achievement: 03/16/21 Potential to Achieve Goals: Fair    Frequency Min 2X/week     Co-evaluation               AM-PAC PT "6 Clicks" Mobility  Outcome Measure Help needed turning from your back to your side while in a flat bed without using  bedrails?: None Help needed moving from lying on your back to sitting on the side of a flat bed without using bedrails?: None Help needed moving to and from a bed to a chair (including a wheelchair)?: A Little Help needed standing up from a chair using your arms (e.g., wheelchair or bedside chair)?: A Little Help needed to walk in hospital room?: Total Help needed climbing 3-5 steps with a railing? : Total 6 Click Score: 16    End of Session   Activity Tolerance: Patient tolerated treatment well;Patient limited by pain Patient left: in bed;with call bell/phone within reach Nurse Communication: Mobility status PT Visit Diagnosis: Muscle weakness (generalized) (M62.81);Unsteadiness on feet (R26.81);Pain Pain - Right/Left: Right Pain - part of body: Leg    Time: 2119-4174 PT Time Calculation (min) (ACUTE ONLY): 32 min   Charges:   PT Evaluation $PT Eval Low Complexity: 1 Low            Shamarra Warda PT, DPT 03/02/2021, 4:02 PM

## 2021-03-02 NOTE — Progress Notes (Signed)
PT Cancellation Note  Patient Details Name: Ruthanna Macchia MRN: [id number] DOB: March 26, 1970   Cancelled Treatment:    Reason Eval/Treat Not Completed: Other (comment) (Pt on bedside commode, requesting medication; RN notified;)  Will re-attempt PT evaluation when patient is agreeable and available to participate.  Baudelio Karnes PT, DPT 03/02/2021, 12:48 PM

## 2021-03-02 NOTE — Assessment & Plan Note (Addendum)
On MiraLAX

## 2021-03-03 DIAGNOSIS — J9601 Acute respiratory failure with hypoxia: Secondary | ICD-10-CM

## 2021-03-03 LAB — GLUCOSE, CAPILLARY
Glucose-Capillary: 101 mg/dL — ABNORMAL HIGH (ref 70–99)
Glucose-Capillary: 139 mg/dL — ABNORMAL HIGH (ref 70–99)
Glucose-Capillary: 161 mg/dL — ABNORMAL HIGH (ref 70–99)
Glucose-Capillary: 138 mg/dL — ABNORMAL HIGH (ref 70–99)

## 2021-03-03 NOTE — Progress Notes (Signed)
Overnight oximetry unavailable at this time. Will start oximetry tomorrow night, 03/04/21.

## 2021-03-03 NOTE — Assessment & Plan Note (Deleted)
Only 1 pulse ox was low at 87% and it was early morning.  Overnight oximetry will be done tonight because it was unavailable last night.

## 2021-03-03 NOTE — Progress Notes (Signed)
°  Progress Note   Patient: Paula Gilmore XQJ:[id number] DOB: 11-Feb-1970 DOA: 02/28/2021     3 DOS: the patient was seen and examined on 03/03/2021     Assessment and Plan: * Cellulitis in diabetic foot ([place]) So far, wound culture not growing any organisms.  Continue Unasyn.  Patient still in a tremendous amount of pain and requires oral and IV meds.  Acute on chronic diastolic CHF (congestive heart failure) ([place]) Patient also has bilateral lymphedema and weeping right lower extremity edema.  Continue Lasix 60 mg IV twice a day.  Ejection fraction 60 to 65%.  Recheck labs tomorrow morning  Type 2 diabetes mellitus (HCC) Hemoglobin A1c 6.7.  Continue Semglee insulin 27 units at night.  Continue sliding scale insulin  Morbid obesity with BMI of 50.0-59.9, adult (HCC) BMI 58.27.  Watch with diuresis.  Fibromyalgia Patient usually only takes Tylenol and ibuprofen for this.  Acute respiratory failure with hypoxia (HCC) Only 1  pulse ox was low at 87% and it was early morning.  We will screen with an overnight oximetry and see if she desats while she is sleeping.  Constipation Add MiraLAX  Weakness Physical therapy evaluation appreciated     Subjective: Patient still having quite a bit of pain in her right lower extremity but able to work with physical therapy.  The upper part of her leg is looking better.  Physical Exam: Vitals:   03/02/21 2100 03/03/21 0402 03/03/21 0804 03/03/21 1126  BP: 129/89 (!) 123/54 (!) 126/92 (!) 116/54  Pulse: 93 87 92 100  Resp: 19 16 18 18   Temp: 98.2 F (36.8 C) 98.4 F (36.9 C) 98.8 F (37.1 C) 97.9 F (36.6 C)  TempSrc: Oral     SpO2: 93% (!) 89% 93% 91%  Weight:      Height:       Physical Exam HENT:     Head: Normocephalic.     Mouth/Throat:     Pharynx: No oropharyngeal exudate.  Eyes:     General: Lids are normal.     Conjunctiva/sclera: Conjunctivae normal.  Cardiovascular:     Rate and Rhythm: Normal rate and regular  rhythm.     Heart sounds: Normal heart sounds, S1 normal and S2 normal.  Pulmonary:     Breath sounds: Normal breath sounds. No decreased breath sounds, wheezing, rhonchi or rales.  Abdominal:     Palpations: Abdomen is soft.     Tenderness: There is no abdominal tenderness.  Musculoskeletal:     Right lower leg: Swelling present.     Left lower leg: Swelling present.  Skin:    General: Skin is warm.     Comments: The area above the wrap is less erythematous.  Right foot still erythematous but less than yesterday.  Neurological:     Mental Status: She is alert and oriented to person, place, and time.     Data Reviewed: Reviewed wound culture and sugars today.  Family Communication: Deferred  Disposition: Status is: Inpatient Remains inpatient appropriate because: Still requiring IV antibiotics for extensive right lower extremity cellulitis  Planned Discharge Destination: Home  Author: Loletha Grayer, MD 03/03/2021 2:09 PM  For on call review [email or web address].

## 2021-03-04 DIAGNOSIS — L089 Local infection of the skin and subcutaneous tissue, unspecified: Secondary | ICD-10-CM

## 2021-03-04 DIAGNOSIS — L03115 Cellulitis of right lower limb: Secondary | ICD-10-CM

## 2021-03-04 DIAGNOSIS — T148XXA Other injury of unspecified body region, initial encounter: Secondary | ICD-10-CM

## 2021-03-04 LAB — BASIC METABOLIC PANEL
Anion gap: 12 (ref 5–15)
BUN: 22 mg/dL — ABNORMAL HIGH (ref 6–20)
CO2: 27 mmol/L (ref 22–32)
Calcium: 9.6 mg/dL (ref 8.9–10.3)
Chloride: 97 mmol/L — ABNORMAL LOW (ref 98–111)
Creatinine, Ser: 0.84 mg/dL (ref 0.44–1.00)
GFR, Estimated: >60 mL/min (ref >60)
Glucose, Bld: 130 mg/dL — ABNORMAL HIGH (ref 70–99)
Potassium: 4 mmol/L (ref 3.5–5.1)
Sodium: 136 mmol/L (ref 135–145)

## 2021-03-04 LAB — MAGNESIUM: Magnesium: 2 mg/dL (ref 1.7–2.4)

## 2021-03-04 LAB — GLUCOSE, CAPILLARY
Glucose-Capillary: 114 mg/dL — ABNORMAL HIGH (ref 70–99)
Glucose-Capillary: 129 mg/dL — ABNORMAL HIGH (ref 70–99)
Glucose-Capillary: 155 mg/dL — ABNORMAL HIGH (ref 70–99)
Glucose-Capillary: 142 mg/dL — ABNORMAL HIGH (ref 70–99)

## 2021-03-04 MED ORDER — LINEZOLID 600 MG PO TABS
600.0000 mg | ORAL_TABLET | Freq: Two times a day (BID) | ORAL | Status: DC
  Administered 2021-03-04 – 2021-03-05 (×2): 600 mg via ORAL
  Filled 2021-03-04 (×3): qty 1

## 2021-03-04 NOTE — Clinical Social Work Note (Signed)
°  °  Durable Medical Equipment  (From admission, onward)           Start     Ordered   03/04/21 1527  For home use only DME wheelchair cushion (seat and back)  Once       Comments: Bariatric wheelchair   03/04/21 1527           Patient suffers from Cellulitis in diabetic foot and Morbid obesity with BMI of 50.0-59.9, adult ([place]) which impairs their ability to perform daily activities in the home.  A walker will not resolve issue with performing activities of daily living. A wheelchair will allow patient to safely perform daily activities. Patient is not able to propel themselves in the home using a standard weight wheelchair due to weakness. Patient can self propel in the lightweight wheelchair.

## 2021-03-04 NOTE — Progress Notes (Signed)
ID Pt Is feeling a little better But still has oozing form the rt leg and swellign No fever  O/e awake and alert Has some discomfort as she cannot position herself comfortably She is sitting with rt leg dependant Chest b/l air entry [id number] Rt leg  03/04/21     03/01/21   Labs Wound culture- staph aureus  Impression/recommendation  Chronic lymphedema of both legs Right leg cellulitis with superficial ulceration with small wounds Culture taken and Staph aureus Awaiting susceptibility Currently on Unasyn.  Will add linezolid Patient will have to keep the leg elevated on 4 pillows  Diabetes mellitus on sliding scale  Morbid obesity  Discussed the management with the patient and the hospitalist

## 2021-03-04 NOTE — Progress Notes (Signed)
Overnight oximetry placed on patient, on room air, at this time. However, patient states she does not feel like the results will be of any value as she suffers from PTSD and insomnia. Patient states she only sleeps a 1-2hours at a time.

## 2021-03-04 NOTE — Progress Notes (Signed)
Physical Therapy Treatment Patient Details Name: Paula Gilmore MRN: [id number] DOB: 07-17-1970 Today's Date: 03/04/2021   History of Present Illness Paula Gilmore is a 51 y.o. 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 Wounds developed in summertime and have gradually worsened; She has been unable to see wound care or podiatry for different reasons including transportation issues. Has never been treated with antibiotics for wounds; Has a sedentary life limited with lymphedema; Pt admitted and given IV antibiotics; Diagnosed with cellulitis of RLE;    PT Comments    Pt sitting edge of bed upon PT arrival.  Stand step turn bed to/from [facility] with UE support modified independent.  Tolerated sitting L LE ex's fairly well (pt reporting h/o B groin injury so deferred hip flexion ex's).  Attempted to get pt laying in bed to perform L LE ex's (pt declining any R LE ex's d/t R LE pain) but pt only able to lay on L side comfortably and unable to position L LE to perform any ex's today.  Will continue to focus on strengthening and progressive functional mobility per pt tolerance.   Recommendations for follow up therapy are one component of a multi-disciplinary discharge planning process, led by the attending physician.  Recommendations may be updated based on patient status, additional functional criteria and insurance authorization.  Follow Up Recommendations  Outpatient PT     Assistance Recommended at Discharge Intermittent Supervision/Assistance  Patient can return home with the following A little help with walking and/or transfers;A little help with bathing/dressing/bathroom;Assistance with cooking/housework;Assist for transportation;Help with stairs or ramp  for entrance   Equipment Recommendations  Other (comment) (would benefit from bariatric wheelchair)    Recommendations for Other Services       Precautions / Restrictions Precautions Precautions: Fall Restrictions Weight Bearing Restrictions: No     Mobility  Bed Mobility Overal bed mobility: Modified Independent             General bed mobility comments: Use of bed rail; increased time/effort to perform on own    Transfers Overall transfer level: Modified independent Equipment used:  (UE support on IV pole)               General transfer comment: stand step turn bed to/from [facility]; initial vc's for safe technique    Ambulation/Gait               General Gait Details: Pt reports not walking d/t R LE pain   Stairs             Wheelchair Mobility    Modified Rankin (Stroke Patients Only)       Balance Overall balance assessment: Needs assistance Sitting-balance support: No upper extremity supported, Feet supported Sitting balance-Leahy Scale: Normal Sitting balance - Comments: steady sitting reaching outside BOS   Standing balance support: Single extremity supported Standing balance-Leahy Scale: Fair Standing balance comment: steady standing with at least single UE support                            Cognition Arousal/Alertness: Awake/alert Behavior During Therapy: WFL for tasks assessed/performed Overall Cognitive Status: Within Functional Limits for tasks assessed  General Comments: Talkative        Exercises Total Joint Exercises Ankle Circles/Pumps: AROM, Strengthening, Left, 10 reps, Seated Long Arc Quad: AROM, Strengthening, Left, 10 reps, Seated    General Comments  Nursing cleared pt for participation in physical therapy.  Pt agreeable to PT session.       Pertinent Vitals/Pain Pain Assessment Pain Assessment: 0-10 Pain Score: 3  Pain Descriptors / Indicators:  Aching, Sore Pain Intervention(s): Limited activity within patient's tolerance, Monitored during session, Repositioned    Home Living                          Prior Function            PT Goals (current goals can now be found in the care plan section) Acute Rehab PT Goals Patient Stated Goal: to go home and get a bariatric wheelchair for mobility PT Goal Formulation: With patient Time For Goal Achievement: 03/16/21 Potential to Achieve Goals: Fair Progress towards PT goals: Progressing toward goals    Frequency    Min 2X/week      PT Plan Current plan remains appropriate    Co-evaluation              AM-PAC PT "6 Clicks" Mobility   Outcome Measure  Help needed turning from your back to your side while in a flat bed without using bedrails?: None Help needed moving from lying on your back to sitting on the side of a flat bed without using bedrails?: None Help needed moving to and from a bed to a chair (including a wheelchair)?: A Little Help needed standing up from a chair using your arms (e.g., wheelchair or bedside chair)?: A Little Help needed to walk in hospital room?: Total Help needed climbing 3-5 steps with a railing? : Total 6 Click Score: 16    End of Session   Activity Tolerance: Patient tolerated treatment well;Patient limited by pain Patient left: in bed;with call bell/phone within reach Nurse Communication: Mobility status PT Visit Diagnosis: Muscle weakness (generalized) (M62.81);Unsteadiness on feet (R26.81);Pain Pain - Right/Left: Right Pain - part of body: Leg     Time: 3474-2595 PT Time Calculation (min) (ACUTE ONLY): 35 min  Charges:  $Therapeutic Exercise: 8-22 mins $Therapeutic Activity: 8-22 mins                     Leitha Bleak, PT 03/04/21, 2:25 PM

## 2021-03-04 NOTE — Progress Notes (Addendum)
°  Progress Note   Patient: Paula Gilmore XBM:[id number] DOB: [birth year] DOA: 02/28/2021     4 DOS: the patient was seen and examined on 03/04/2021     Assessment and Plan: * Cellulitis in diabetic foot ([place]) Continue Unasyn.  Leg still red today.  Case discussed with infectious disease specialist and will reevaluate again tomorrow.  Wound culture negative.  Will be unable to set up home health and will have to follow-up at the wound care center.  Right lower extremity wounds will have to be covered until healed.  Acute on chronic diastolic CHF (congestive heart failure) ([place]) Patient also has bilateral lymphedema and weeping right lower extremity edema.  Continue Lasix 60 mg IV twice a day.  Ejection fraction 60 to 65%.  Weeping edema seems less today.  Type 2 diabetes mellitus (HCC) Hemoglobin A1c 6.7.  Continue Semglee insulin 27 units at night.  Continue sliding scale insulin.  Last four sugars are 161, 138, 114 and 129.  Morbid obesity with BMI of 50.0-59.9, adult (HCC) BMI 58.29.  Watch with diuresis.  Fibromyalgia Patient usually only takes Tylenol and ibuprofen for this.  Acute respiratory failure with hypoxia (HCC) Only 1 pulse ox was low at 87% and it was early morning.  Overnight oximetry will be done tonight because it was unavailable last night.  Constipation On MiraLAX  Weakness Physical therapy evaluation appreciated        Subjective: Patient having less pain in her leg but still having it.  Urinating well.  Weeping edema is a little bit less.  Redness on her leg is still there but less angry than previous.  Physical Exam: Vitals:   03/04/21 0401 03/04/21 0500 03/04/21 0753 03/04/21 1130  BP: (!) 112/91  103/72 (!) 142/93  Pulse: 93  70 89  Resp: 19  17 17   Temp: 98 F (36.7 C)  (!) 97.5 F (36.4 C) 98.6 F (37 C)  TempSrc: Oral     SpO2: 92%  92% 93%  Weight:  (!) 163.8 kg    Height:       Physical Exam HENT:     Head: Normocephalic.      Mouth/Throat:     Pharynx: No oropharyngeal exudate.  Eyes:     General: Lids are normal.     Conjunctiva/sclera: Conjunctivae normal.  Cardiovascular:     Rate and Rhythm: Normal rate and regular rhythm.     Heart sounds: Normal heart sounds, S1 normal and S2 normal.  Pulmonary:     Breath sounds: Normal breath sounds. No decreased breath sounds, wheezing, rhonchi or rales.  Abdominal:     Palpations: Abdomen is soft.     Tenderness: There is no abdominal tenderness.  Musculoskeletal:     Right lower leg: Swelling present.     Left lower leg: Swelling present.  Skin:    General: Skin is warm.     Comments: See picture below.  Neurological:     Mental Status: She is alert and oriented to person, place, and time.        Data Reviewed: Wound culture still negative.  Creatinine 0.84 and potassium 4.0  Family Communication: Deferred  Disposition: Status is: Inpatient Remains inpatient appropriate because: Still requiring IV antibiotics for extensive right lower extremity cellulitis  Planned Discharge Destination: Home  Author: Loletha Grayer, MD 03/04/2021 1:03 PM  For on call review [email or web address].

## 2021-03-05 DIAGNOSIS — R112 Nausea with vomiting, unspecified: Secondary | ICD-10-CM

## 2021-03-05 DIAGNOSIS — G4734 Idiopathic sleep related nonobstructive alveolar hypoventilation: Secondary | ICD-10-CM

## 2021-03-05 DIAGNOSIS — R519 Headache, unspecified: Secondary | ICD-10-CM

## 2021-03-05 DIAGNOSIS — G8929 Other chronic pain: Secondary | ICD-10-CM

## 2021-03-05 LAB — MAGNESIUM: Magnesium: 2.1 mg/dL (ref 1.7–2.4)

## 2021-03-05 LAB — BASIC METABOLIC PANEL
Anion gap: 8 (ref 5–15)
BUN: 24 mg/dL — ABNORMAL HIGH (ref 6–20)
CO2: 31 mmol/L (ref 22–32)
Calcium: 9.4 mg/dL (ref 8.9–10.3)
Chloride: 95 mmol/L — ABNORMAL LOW (ref 98–111)
Creatinine, Ser: 0.81 mg/dL (ref 0.44–1.00)
GFR, Estimated: >60 mL/min (ref >60)
Glucose, Bld: 126 mg/dL — ABNORMAL HIGH (ref 70–99)
Potassium: 3.7 mmol/L (ref 3.5–5.1)
Sodium: 134 mmol/L — ABNORMAL LOW (ref 135–145)

## 2021-03-05 LAB — GLUCOSE, CAPILLARY
Glucose-Capillary: 124 mg/dL — ABNORMAL HIGH (ref 70–99)
Glucose-Capillary: 157 mg/dL — ABNORMAL HIGH (ref 70–99)
Glucose-Capillary: 115 mg/dL — ABNORMAL HIGH (ref 70–99)
Glucose-Capillary: 145 mg/dL — ABNORMAL HIGH (ref 70–99)

## 2021-03-05 MED ORDER — BUTALBITAL-APAP-CAFFEINE 50-325-40 MG PO TABS
1.0000 | ORAL_TABLET | Freq: Four times a day (QID) | ORAL | Status: DC | PRN
  Administered 2021-03-05: 1 via ORAL
  Filled 2021-03-05: qty 1

## 2021-03-05 MED ORDER — MAGNESIUM SULFATE 2 GM/50ML IV SOLN
2.0000 g | Freq: Once | INTRAVENOUS | Status: AC
  Administered 2021-03-05: 2 g via INTRAVENOUS
  Filled 2021-03-05: qty 50

## 2021-03-05 MED ORDER — KETOROLAC TROMETHAMINE 15 MG/ML IJ SOLN
15.0000 mg | Freq: Once | INTRAMUSCULAR | Status: AC
  Administered 2021-03-05: 15 mg via INTRAVENOUS
  Filled 2021-03-05: qty 1

## 2021-03-05 MED ORDER — ENOXAPARIN SODIUM 100 MG/ML IJ SOSY
0.5000 mg/kg | PREFILLED_SYRINGE | INTRAMUSCULAR | Status: DC
  Administered 2021-03-05: 90 mg via SUBCUTANEOUS
  Filled 2021-03-05 (×2): qty 0.9

## 2021-03-05 NOTE — TOC Progression Note (Signed)
Transition of Care [facility]) - Progression Note    Patient Details  Name: Makiah Foye MRN: [id number] Date of Birth: 1970-05-20  [facility] of [facility]) CM/SW Contact  Eileen Stanford, LCSW Phone Number: 03/05/2021, 3:08 PM  Clinical Narrative:   Thedore Mins with [employer] notified that pt will need bariatric wheelchair.    Expected Discharge Plan: [place] Barriers to Discharge: Continued Medical Work up  Expected Discharge Plan and Services Expected Discharge Plan: [place]   Discharge Planning Services: CM Consult   Living arrangements for the past 2 months: Single Family Home                 DME Arranged: N/A         HH Arranged: RN           Social Determinants of Health (SDOH) Interventions    Readmission Risk Interventions No flowsheet data found.

## 2021-03-05 NOTE — Plan of Care (Signed)
°  Problem: Clinical Measurements: Goal: Respiratory complications will improve Outcome: Not Progressing  During overnight pt sats ranging 60-80 % when sleeping. Oxygen at 3.5 liters placed due to signs of sleep apnea. This am respiratory came by and was updated on the oxygen. Informed when placed at 0111 due to desating.  Problem: Education: Goal: Knowledge of General Education information will improve Description: Including pain rating scale, medication(s)/side effects and non-pharmacologic comfort measures Outcome: Progressing   Problem: Health Behavior/Discharge Planning: Goal: Ability to manage health-related needs will improve Outcome: Progressing   Problem: Clinical Measurements: Goal: Ability to maintain clinical measurements within normal limits will improve Outcome: Progressing Goal: Will remain free from infection Outcome: Progressing Goal: Diagnostic test results will improve Outcome: Progressing Goal: Cardiovascular complication will be avoided Outcome: Progressing   Problem: Activity: Goal: Risk for activity intolerance will decrease Outcome: Progressing   Problem: Nutrition: Goal: Adequate nutrition will be maintained Outcome: Progressing   Problem: Coping: Goal: Level of anxiety will decrease Outcome: Progressing   Problem: Elimination: Goal: Will not experience complications related to bowel motility Outcome: Progressing Goal: Will not experience complications related to urinary retention Outcome: Progressing   Problem: Pain Managment: Goal: General experience of comfort will improve Outcome: Progressing   Problem: Safety: Goal: Ability to remain free from injury will improve Outcome: Progressing   Problem: Skin Integrity: Goal: Risk for impaired skin integrity will decrease Outcome: Progressing

## 2021-03-05 NOTE — Assessment & Plan Note (Signed)
I did give IV Toradol and IV magnesium and as needed Fioricet.  Seems a little bit better this afternoon after reevaluation

## 2021-03-05 NOTE — Hospital Course (Signed)
51 year old female with past medical history of COPD, fibromyalgia, GERD, arthritis, morbid obesity and lymphedema.  She was admitted to the hospital with right leg pain, nonhealing wound and cellulitis of the right lower extremity.  Initially placed on aggressive antibiotics.  Antibiotics changed to Unasyn by infectious disease.  With Staph aureus growing out of the wound culture Zyvox was added on 03/04/2021.  The leg has taken a while to improve but is now looking a little bit better.  The patient was having a lot of nausea vomiting and headache on the morning of 03/05/2021.  I did give a dose of Toradol, IV magnesium, as needed Fioricet and IV Zofran.

## 2021-03-05 NOTE — Assessment & Plan Note (Addendum)
Overnight oximetry done.  Patient had 5 desaturations in the 60s, 58 desaturations in the 70s and 115 desaturation into the 80s.  Likely has underlying sleep apnea.  Qualifies for nocturnal oxygen 2 L at night.  Will need overnight sleep study as outpatient.

## 2021-03-05 NOTE — Progress Notes (Signed)
ID Pt Is feeling a little better Says she has no further vomiting  O/e awake and alert Patient Vitals for the past 24 hrs:  BP Temp Pulse Resp SpO2 Weight  03/05/21 2006 130/88 97.9 F (36.6 C) 89 16 92 % --  03/05/21 1520 120/66 98.5 F (36.9 C) 76 17 92 % --  03/05/21 1457 -- -- -- -- -- (!) 174.6 kg  03/05/21 1114 117/87 97.9 F (36.6 C) 91 17 (!) 86 % --  03/05/21 0749 128/71 97.8 F (36.6 C) 86 17 96 % --  03/05/21 0500 -- -- -- -- -- (!) 179.2 kg  03/05/21 0123 -- -- -- -- 93 % --  03/05/21 0111 (!) 147/67 (!) 97.4 F (36.3 C) 92 16 (!) 88 % --  03/05/21 0105 -- -- -- -- (!) 64 % --    She is sitting with rt leg dependant Chest b/l air entry [id number] Rt leg  03/04/21      03/01/21   Labs Wound culture- staph aureus  Impression/recommendation  Chronic lymphedema of both legs Right leg cellulitis with superficial ulceration with small wounds ,improving Culture neg Currently on Unasyn and linezolid .  DC linezolid as no staph aureus On discharge she could go on augmentin until 03/12/21 Diabetes mellitus on sliding scale  Morbid obesity  Discussed the management with the patient and the hospitalist ID will sign off- call if needed

## 2021-03-05 NOTE — Progress Notes (Signed)
Progress Note   Patient: Paula Gilmore HWE:[id number] DOB: April 05, 1970 DOA: 02/28/2021     5 DOS: the patient was seen and examined on 03/05/2021   Brief hospital course: 51 year old female with past medical history of COPD, fibromyalgia, GERD, arthritis, morbid obesity and lymphedema.  She was admitted to the hospital with right leg pain, nonhealing wound and cellulitis of the right lower extremity.  Initially placed on aggressive antibiotics.  Antibiotics changed to Unasyn by infectious disease.  With Staph aureus growing out of the wound culture Zyvox was added on 03/04/2021.  The leg has taken a while to improve but is now looking a little bit better.  The patient was having a lot of nausea vomiting and headache on the morning of 03/05/2021.  I did give a dose of Toradol, IV magnesium, as needed Fioricet and IV Zofran.  Assessment and Plan: * Cellulitis in diabetic foot ([place]) Continue Unasyn.  Infectious disease added Zyvox last night.  Dr. Steva Ready will call the lab and see what the sensitivities are on the Staph aureus and adjust antibiotics.  Wound culture now showing some Staph aureus.  Will be unable to set up home health and will have to follow-up at the wound care center.  Right lower extremity wounds will have to be covered until healed.  Nausea & vomiting Patient given IV Zofran and this seems improved but still had some belching.  Reevaluate tomorrow.  Wondering if the Zyvox that was added last night could be causing.  Acute on chronic diastolic CHF (congestive heart failure) ([place]) Patient also has bilateral lymphedema and weeping right lower extremity edema.  Continue Lasix 60 mg IV twice a day.  Ejection fraction 60 to 65%.  Weeping edema seems less today.  Headache (Primary Area of Pain)- (present on admission) I did give IV Toradol and IV magnesium and as needed Fioricet.  Seems a little bit better this afternoon after reevaluation  Type 2 diabetes mellitus  (HCC) Hemoglobin A1c 6.7.  Continue Semglee insulin 27 units at night.  Continue sliding scale insulin.  Last four sugars are 155, 142, 124 and 157  Morbid obesity with BMI of 50.0-59.9, adult (HCC) BMI 58.29 on previous BMI.  Today's BMI of 63.77 and is likely falsely high because it was a bed weight.  I asked the nursing staff to recheck her weight on a standing scale.  Fibromyalgia Patient usually only takes Tylenol and ibuprofen for this.  Nocturnal hypoxia Overnight oximetry done.  Patient had 5 desaturations in the 60s, 58 desaturations in the 70s and 115 desaturation into the 80s.  Likely has underlying sleep apnea.  Qualifies for nocturnal oxygen 2 L at night.  Will need overnight sleep study as outpatient.  Constipation On MiraLAX  Weakness Physical therapy evaluation appreciated        Subjective: When I walked in the room this morning the patient was having active retching and a lot of nausea.  She was having a headache severe in nature.  Initially admitted with right lower extremity cellulitis  Physical Exam: Vitals:   03/05/21 0123 03/05/21 0500 03/05/21 0749 03/05/21 1114  BP:   128/71 117/87  Pulse:   86 91  Resp:   17 17  Temp:   97.8 F (36.6 C) 97.9 F (36.6 C)  TempSrc:      SpO2: 93%  96% (!) 86%  Weight:  (!) 179.2 kg    Height:       Physical Exam HENT:  Head: Normocephalic.     Mouth/Throat:     Pharynx: No oropharyngeal exudate.  Eyes:     General: Lids are normal.     Conjunctiva/sclera: Conjunctivae normal.  Cardiovascular:     Rate and Rhythm: Normal rate and regular rhythm.     Heart sounds: Normal heart sounds, S1 normal and S2 normal.  Pulmonary:     Breath sounds: Normal breath sounds. No decreased breath sounds, wheezing, rhonchi or rales.  Abdominal:     Palpations: Abdomen is soft.     Tenderness: There is no abdominal tenderness.  Musculoskeletal:     Right lower leg: Swelling present.     Left lower leg: Swelling  present.  Skin:    General: Skin is warm.     Comments: See picture below.  Neurological:     Mental Status: She is alert and oriented to person, place, and time.        Data Reviewed:  Laboratory data reviewed from today.  Today sodium 134.  Chloride 95.  BUN starting to creep up at 24.  Creatinine 0.81.  Family Communication: Deferred  Disposition: Status is: Inpatient Remains inpatient appropriate because: Nausea vomiting and headache today.  Planned Discharge Destination: Home  Author: Loletha Grayer, MD 03/05/2021 2:43 PM  For on call review [email or web address].

## 2021-03-05 NOTE — Assessment & Plan Note (Signed)
Patient given IV Zofran and this seems improved but still had some belching.  Reevaluate tomorrow.  Wondering if the Zyvox that was added last night could be causing.

## 2021-03-06 LAB — BASIC METABOLIC PANEL
Anion gap: 10 (ref 5–15)
BUN: 24 mg/dL — ABNORMAL HIGH (ref 6–20)
CO2: 28 mmol/L (ref 22–32)
Calcium: 9.7 mg/dL (ref 8.9–10.3)
Chloride: 94 mmol/L — ABNORMAL LOW (ref 98–111)
Creatinine, Ser: 0.84 mg/dL (ref 0.44–1.00)
GFR, Estimated: >60 mL/min (ref >60)
Glucose, Bld: 136 mg/dL — ABNORMAL HIGH (ref 70–99)
Potassium: 4.4 mmol/L (ref 3.5–5.1)
Sodium: 132 mmol/L — ABNORMAL LOW (ref 135–145)

## 2021-03-06 LAB — GLUCOSE, CAPILLARY
Glucose-Capillary: 122 mg/dL — ABNORMAL HIGH (ref 70–99)
Glucose-Capillary: 113 mg/dL — ABNORMAL HIGH (ref 70–99)

## 2021-03-06 LAB — CBC
HCT: 47.4 % — ABNORMAL HIGH (ref 36.0–46.0)
Hemoglobin: 14.9 g/dL (ref 12.0–15.0)
MCH: 28.3 pg (ref 26.0–34.0)
MCHC: 31.4 g/dL (ref 30.0–36.0)
MCV: 89.9 fL (ref 80.0–100.0)
Platelets: 340 10*3/uL (ref 150–400)
RBC: 5.27 MIL/uL — ABNORMAL HIGH (ref 3.87–5.11)
RDW: 13.2 % (ref 11.5–15.5)
WBC: 9.4 10*3/uL (ref 4.0–10.5)
nRBC: 0 % (ref 0.0–0.2)

## 2021-03-06 LAB — AEROBIC/ANAEROBIC CULTURE W GRAM STAIN (SURGICAL/DEEP WOUND)
Culture: NORMAL
Special Requests: NORMAL

## 2021-03-06 MED ORDER — FUROSEMIDE 20 MG PO TABS: 60.0000 mg | ORAL_TABLET | Freq: Every day | ORAL | 0 refills | Status: AC

## 2021-03-06 MED ORDER — POTASSIUM CHLORIDE CRYS ER 20 MEQ PO TBCR
20.0000 meq | EXTENDED_RELEASE_TABLET | Freq: Two times a day (BID) | ORAL | Status: DC
  Administered 2021-03-06: 20 meq via ORAL
  Filled 2021-03-06: qty 1

## 2021-03-06 MED ORDER — AMOXICILLIN-POT CLAVULANATE 875-125 MG PO TABS: 1.0000 | ORAL_TABLET | Freq: Two times a day (BID) | ORAL | 0 refills | Status: AC

## 2021-03-06 NOTE — TOC Progression Note (Addendum)
Transition of Care [facility]) - Progression Note    Patient Details  Name: Paula Gilmore MRN: [id number] Date of Birth: 01/20/1971  Transition of Care [facility]) CM/SW Contact  Eileen Stanford, LCSW Phone Number: 03/06/2021, 1:22 PM  Clinical Narrative:   [employer] tw[employer] deliver the 02 for night TO THE HOME. Per previous note pt will not get HHRN to do wound care, due to lack of insurance to cover [facility] RN. Therefore pt will have to follow up outpatient at wound care clinic. Information added to AVS.    Expected Discharge Plan: [place] Barriers to Discharge: Continued Medical Work up  Expected Discharge Plan and Services Expected Discharge Plan: [place]   Discharge Planning Services: CM Consult   Living arrangements for the past 2 months: Single Family Home Expected Discharge Date: 03/06/21               DME Arranged: N/A         HH Arranged: RN           Social Determinants of Health (SDOH) Interventions    Readmission Risk Interventions No flowsheet data found.

## 2021-03-06 NOTE — Progress Notes (Addendum)
Physical Therapy Treatment Patient Details Name: Paula Gilmore MRN: [id number] DOB: [birth year] Today's Date: 03/06/2021   History of Present Illness Paula Gilmore is a 51 y.o. 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 Wounds developed in summertime and have gradually worsened; She has been unable to see wound care or podiatry for different reasons including transportation issues. Has never been treated with antibiotics for wounds; Has a sedentary life limited with lymphedema; Pt admitted and given IV antibiotics; Diagnosed with cellulitis of RLE;    PT Comments    Pt was I'ly sitting EOB with breakfast tray at bedside. She is A and O x 4 and agreeable to PT session. Most of session educating pt on proper positioning to promote wound healing. She states understanding however even when encouraged to repositioning during session, she was unwilling. Author answers all pt's questions and discussed barriers to DC. Pt was able to safely get in/out of w/c and did take several steps. Most limited by wound pain. Pt has received bariatric WC. Per pt," My landlord is building ramp. I am planning to go to my mothers house at DC until ramp is finished." PT recommends OP PT at DC to assist pt with obtaining maximal independence with all ADL.   Author returned later in morning to address stairs. Pt will have one small threshold to enter mothers house. Plan is to DC to mothers when medically cleared. She demonstrated safe ability to perform. Author also demonstrated proper performance with stairs using w/c. She states confidence and understanding. See flowsheet for more information/billing.     Recommendations for follow up therapy are one component of a  multi-disciplinary discharge planning process, led by the attending physician.  Recommendations may be updated based on patient status, additional functional criteria and insurance authorization.  Follow Up Recommendations  Outpatient PT     Assistance Recommended at Discharge Intermittent Supervision/Assistance  Patient can return home with the following A little help with walking and/or transfers;A little help with bathing/dressing/bathroom;Assistance with cooking/housework;Assist for transportation;Help with stairs or ramp for entrance   Equipment Recommendations  None recommended by PT       Precautions / Restrictions Precautions Precautions: Fall Restrictions Weight Bearing Restrictions: No     Mobility  Bed Mobility Overal bed mobility: Modified Independent             General bed mobility comments: pt was seated EOB pre/post session    Transfers Overall transfer level: Modified independent Equipment used: Rolling walker (2 wheels)      General transfer comment: no physical assistance required to stand from lowest bed height    Ambulation/Gait Ambulation/Gait assistance: Supervision Gait Distance (Feet): 5 Feet Assistive device: Rolling walker (2 wheels) Gait Pattern/deviations: Step-to pattern, Trunk flexed Gait velocity: decreased     General Gait Details: Pt was able to safely take a few steps to get to w/c and BSC.   Stairs Stairs: Yes  General stair comments: pt states she is having ramp being build currently. PLanning to go to mothers house at DC until ramp is completed. Per pt, " mom doesnt have any steps to get into the house."    Balance Overall balance assessment: Needs assistance Sitting-balance support: No upper extremity supported, Feet supported Sitting balance-Leahy Scale: Normal  Standing balance support: Single extremity supported Standing balance-Leahy Scale: Fair      Cognition Arousal/Alertness: Awake/alert Behavior During  Therapy: WFL for tasks assessed/performed Overall Cognitive Status: Within Functional Limits for tasks assessed      General Comments: Pt is A and O x4. required extensive education on importance of positioning and ther ex to promote wound healing           General Comments General comments (skin integrity, edema, etc.): Lengthy discussion about importance of maintaining fluid retrictions/ positioning for wound healing, and what to expect with OP PT going forward.      Pertinent Vitals/Pain Pain Assessment Pain Assessment: 0-10 Pain Score: 3  Pain Intervention(s): Limited activity within patient's tolerance, Monitored during session, Premedicated before session     PT Goals (current goals can now be found in the care plan section) Acute Rehab PT Goals Patient Stated Goal: to go home Progress towards PT goals: Progressing toward goals    Frequency    Min 2X/week      PT Plan Current plan remains appropriate       AM-PAC PT "6 Clicks" Mobility   Outcome Measure  Help needed turning from your back to your side while in a flat bed without using bedrails?: None Help needed moving from lying on your back to sitting on the side of a flat bed without using bedrails?: None Help needed moving to and from a bed to a chair (including a wheelchair)?: A Little Help needed standing up from a chair using your arms (e.g., wheelchair or bedside chair)?: A Little Help needed to walk in hospital room?: A Little Help needed climbing 3-5 steps with a railing? : A Lot 6 Click Score: 19    End of Session   Activity Tolerance: Patient tolerated treatment well;Patient limited by pain Patient left: in bed;with call bell/phone within reach Nurse Communication: Mobility status PT Visit Diagnosis: Muscle weakness (generalized) (M62.81);Unsteadiness on feet (R26.81);Pain Pain - Right/Left: Right Pain - part of body: Leg     Time: 0812-0850 PT Time Calculation (min) (ACUTE ONLY): 38  min  Charges:  $Therapeutic Activity: 38-52 mins                     Julaine Fusi PTA 03/06/21, 8:55 AM

## 2021-03-06 NOTE — Clinical Social Work Note (Signed)
Occupational Therapy * Physical Therapy * Speech Therapy          DATE ___2/8/23________________ PATIENT NAME__[name]___ PATIENT MRN__[id number]______________  DIAGNOSIS/DIAGNOSIS CODE __Cellulitis in diabetic foot/ E11.628___ DATE OF DISCHARGE: ___2/8/23___________  PRIMARY CARE PHYSICIAN __Dr Royetta Crochet PCP PHONE/FAX___[phone]_______     Dear Provider (Name: __________________   Fax: ___________________________):   I certify that I have examined this patient and that occupational/physical/speech therapy is necessary on an outpatient basis.    The patient has expressed interest in completing their recommended course of therapy at your location.  Once a formal order from the patient's primary care physician has been obtained, please contact him/her to schedule an appointment for evaluation at your earliest convenience.   [  X]  Physical Therapy Evaluate and Treat   [ X ]  Occupational Therapy Evaluate and Treat     [  ]  Speech Therapy Evaluate and Treat       The patient's primary care physician (listed above) must furnish and be responsible for a formal order such that the recommended services may be furnished while under the primary physician's care, and that the plan of care will be established and reviewed every 30 days (or more often if condition necessitates).

## 2021-03-06 NOTE — Plan of Care (Signed)
Pt has received 1 dose of prn medication. Dsg changed at 0300 due to dsg was change around 5pm. Pt requested 0300. Pt had to wear oxygen 3.5 liters during night due to desating when she sleep related to sleep apnea hx. Pt refuses cpap  Problem: Education: Goal: Knowledge of General Education information will improve Description: Including pain rating scale, medication(s)/side effects and non-pharmacologic comfort measures Outcome: Progressing   Problem: Health Behavior/Discharge Planning: Goal: Ability to manage health-related needs will improve Outcome: Progressing   Problem: Clinical Measurements: Goal: Ability to maintain clinical measurements within normal limits will improve Outcome: Progressing Goal: Will remain free from infection Outcome: Progressing Goal: Diagnostic test results will improve Outcome: Progressing Goal: Respiratory complications will improve Outcome: Progressing Goal: Cardiovascular complication will be avoided Outcome: Progressing   Problem: Activity: Goal: Risk for activity intolerance will decrease Outcome: Progressing   Problem: Nutrition: Goal: Adequate nutrition will be maintained Outcome: Progressing   Problem: Coping: Goal: Level of anxiety will decrease Outcome: Progressing   Problem: Elimination: Goal: Will not experience complications related to bowel motility Outcome: Progressing Goal: Will not experience complications related to urinary retention Outcome: Progressing   Problem: Pain Managment: Goal: General experience of comfort will improve Outcome: Progressing   Problem: Safety: Goal: Ability to remain free from injury will improve Outcome: Progressing   Problem: Skin Integrity: Goal: Risk for impaired skin integrity will decrease Outcome: Progressing

## 2021-03-06 NOTE — Discharge Summary (Signed)
Physician Discharge Summary  Yeila Morro Bohnet ZOX:[id number] DOB: 11-11-1970 DOA: 02/28/2021  PCP: Theotis Burrow, MD  Admit date: 02/28/2021 Discharge date: 03/06/2021  Admitted From: home Disposition: home  Recommendations for Outpatient Follow-up:  Follow up with PCP in 1-2 weeks F/u w/ cardio in 1-2 weeks F/u w/ wound care center w/in 1 week   Home Health: no  Equipment/Devices: nocturnal oxygen  Discharge Condition: stable  CODE STATUS: full  Diet recommendation: Heart Healthy / Carb Modified   Brief/Interim Summary: HPI was taken from Dr. Celesta Gentile: Diamante Truszkowski is a 51 y.o. female with medical history significant of IDDM, HTN, chronic lymphedema on Lasix, history of cardiomyopathy with recovered LVEF 60-65% 2021, morbid obesity, COPD, chronic right lower extremity wounds came with worsening of pain, discharge of the right leg wound.   Patient has had poor wounds on right lower extremities developed in summertime, plan initially was a small opening done gradually getting worse.  For the last several month, she has been doing self cleaning and wound care at home by herself and her family member using diluted peroxide and coconut oil.  But increasingly, she noticed increasing of size of the wound and the depth of the wound and more discharge than before since last week associated with more frequent throbbing-like pain worsening with minimum movement of the leg.  Denies any fever chills.  She called her PCP about the wound and referred to see wound care however for different reasons such as transportation she has never been seen by wound care or podiatry.  At baseline, she is very much sedentary life with limited mobility secondary to her body habitus and lymphedema.  She takes only 20 mg Lasix daily, and she has been noticing increasing swelling of the legs.  She denies any cough no shortness of breath or chest pains.  She was never treated with antibiotics for the right  leg wounds.   ED Course: Patient was found afebrile, no hypotension no tachycardia.  WBC 12.5, creatinine 0.9, potassium 4.5.   X-ray showed extensive soft tissue edema in the lower extremities no subcutaneous gas or foreign body.   ED started patient on vancomycin and cefepime.  As per Dr. Leslye Peer: 51 year old female with past medical history of COPD, fibromyalgia, GERD, arthritis, morbid obesity and lymphedema.  She was admitted to the hospital with right leg pain, nonhealing wound and cellulitis of the right lower extremity.  Initially placed on aggressive antibiotics.  Antibiotics changed to Unasyn by infectious disease.  With Staph aureus growing out of the wound culture Zyvox was added on 03/04/2021.  The leg has taken a while to improve but is now looking a little bit better.   The patient was having a lot of nausea vomiting and headache on the morning of 03/05/2021.  I did give a dose of Toradol, IV magnesium, as needed Fioricet and IV Zofran.  As per Dr. Jimmye Norman 03/06/21: Pt was medically stable for d/c. Pt was d/c home w/ po augmentin until 03/12/21 as per ID. Of note, pt was also d/c home on po lasix for CHF. Pt did receive education on CHF. For more information, please see previous progress/consult notes.    Discharge Diagnoses:  Principal Problem:   Cellulitis in diabetic foot ([place]) Active Problems:   Headache (Primary Area of Pain)   Edema   Morbid obesity with BMI of 50.0-59.9, adult (HCC)   Fibromyalgia   Type 2 diabetes mellitus (HCC)   Acute on chronic diastolic CHF (congestive  heart failure) (HCC)   Weakness   Constipation   Acute respiratory failure with hypoxia (HCC)   Nausea & vomiting   Nocturnal hypoxia   Cellulitis in diabetic foot: continue on unasyn while inpatient and changed to po augmentin until 03/12/21 as per ID. Wound cx growing rare normal skin flora. Will have to f/u outpatient wound care center as CM unable to set up [facility]    Nausea & vomiting: etiology  unclear, possibly medication ADRs. Zofran prn   Acute on chronic diastolic CHF: w/ bilateral lymphedema and weeping right lower extremity edema. Continue on lasix. Monitor I/Os. Will need to f/u outpatient w/ cardio    Headache: tylenol & fioricet prn    DM2: well controlled, HbA1c 6.7. Continue on glargine, SSI w/ accuchecks   Morbid obesity: BMI 61.6. Complicates overall care & prognosis    Fibromyalgia: tylenol prn    Nocturnal hypoxia: multiple desaturations overnight. Qualifies for nocturnal oxygen 2 L at night. Needs outpatient sleep study    Constipation: continue on miralax   Discharge Instructions  Discharge Instructions     Diet - low sodium heart healthy   Complete by: As directed    Diet Carb Modified   Complete by: As directed    Discharge instructions   Complete by: As directed    F/u w/ PCP in 1-2 weeks. F/u w/ wound care w/in 1 week. F/u w/ cardio in 1-2 weeks   Discharge wound care:   Complete by: As directed    Wound care  Every shift      Comments: BEDSIDE NURSE TO PERFORM.   Use No Rinse Spray cleanser (from clean utility) to cleanse the wound areas on the RLE. Place Xeroform gauzes over all open wounds. Top with multiple ABD pads. Secure with Kerlix.  Wash between the toes of the right foot. Weave a strip of Aquacel Advantage Kellie Simmering [phone]) between the toes of the right foot.  Change the dressings to the leg and foot when they are soiled with drainage.   Increase activity slowly   Complete by: As directed       Allergies as of 03/06/2021       Reactions   Duloxetine Palpitations   Causes V-Tach-per Patient   Milnacipran Palpitations   Causes V-Tach per patient   Cymbalta [duloxetine Hcl] Other (See Comments)   V-tach   Montelukast Hives   Tizanidine Hives   Savella [milnacipran Hcl] Palpitations        Medication List     STOP taking these medications    insulin aspart protamine- aspart (70-30) 100 UNIT/ML injection Commonly known as:  NOVOLOG MIX 70/30       TAKE these medications    acetaminophen 500 MG tablet Commonly known as: TYLENOL Take 1,000 mg by mouth every 6 (six) hours as needed.   albuterol (2.5 MG/3ML) 0.083% nebulizer solution Commonly known as: PROVENTIL Take 2.5 mg by nebulization every 6 (six) hours as needed for wheezing or shortness of breath.   amoxicillin-clavulanate 875-125 MG tablet Commonly known as: Augmentin Take 1 tablet by mouth 2 (two) times daily for 6 days.   aspirin EC 81 MG tablet Take 81 mg by mouth daily. Swallow whole.   blood glucose meter kit and supplies Kit Dispense based on patient and insurance preference. Use up to four times daily as directed. (FOR ICD-9 250.00, 250.01).   calcium carbonate 500 MG chewable tablet Commonly known as: TUMS - dosed in mg elemental calcium Chew 1 tablet (200  mg of elemental calcium total) by mouth 4 (four) times daily as needed for indigestion or heartburn.   cetirizine 10 MG tablet Commonly known as: ZYRTEC Take 10 mg by mouth 2 (two) times daily.   cholecalciferol 25 MCG (1000 UNIT) tablet Commonly known as: VITAMIN D3 Take 2,000 Units by mouth daily.   dicyclomine 10 MG capsule Commonly known as: BENTYL Take 10 mg by mouth 4 (four) times daily as needed for spasms.   fluticasone 50 MCG/ACT nasal spray Commonly known as: FLONASE Place 2 sprays into both nostrils daily.   furosemide 20 MG tablet Commonly known as: Lasix Take 3 tablets (60 mg total) by mouth daily. What changed: how much to take   ipratropium-albuterol 0.5-2.5 (3) MG/3ML Soln Commonly known as: DUONEB Take 3 mLs by nebulization every 6 (six) hours as needed.   Lantus SoloStar 100 UNIT/ML Solostar Pen Generic drug: insulin glargine Inject 35 Units into the skin at bedtime.   lisinopril 5 MG tablet Commonly known as: ZESTRIL Take 5 mg by mouth daily.   loperamide 2 MG capsule Commonly known as: IMODIUM Take 2 mg by mouth as needed.    multivitamin with minerals Tabs tablet Take 1 tablet by mouth daily.   NovoLOG FlexPen 100 UNIT/ML FlexPen Generic drug: insulin aspart CBG 70 - 120: 0 units CBG 121 - 150: 2 units CBG 151 - 200: 3 units CBG 201 - 250: 5 units CBG 251 - 300: 8 units CBG 301 - 350: 11 units CBG 351 - 400: 15 units   omeprazole 20 MG capsule Commonly known as: PRILOSEC Take 2 capsules (40 mg total) by mouth daily.   Pen Needles 30G X 5 MM Misc To Use Three times a day.   sucralfate 1 g tablet Commonly known as: CARAFATE Take 1 g by mouth 4 (four) times daily.               Durable Medical Equipment  (From admission, onward)           Start     Ordered   03/05/21 1441  For home use only DME oxygen  Once       Question Answer Comment  Length of Need Lifetime   Mode or (Route) Nasal cannula   Liters per Minute 2   Frequency Only at night (stationary unit needed)   Oxygen conserving device Yes   Oxygen delivery system Gas      03/05/21 1441   03/04/21 1527  For home use only DME wheelchair cushion (seat and back)  Once       Comments: Bariatric wheelchair   03/04/21 1527              Discharge Care Instructions  (From admission, onward)           Start     Ordered   03/06/21 0000  Discharge wound care:       Comments: Wound care  Every shift      Comments: BEDSIDE NURSE TO PERFORM.   Use No Rinse Spray cleanser (from clean utility) to cleanse the wound areas on the RLE. Place Xeroform gauzes over all open wounds. Top with multiple ABD pads. Secure with Kerlix.  Wash between the toes of the right foot. Weave a strip of Aquacel Advantage Kellie Simmering [phone]) between the toes of the right foot.  Change the dressings to the leg and foot when they are soiled with drainage.   03/06/21 1305  Follow-up Information     [facility] Follow up in 1 week(s).   Specialty: Wound Care Contact information: [address] [id number] [place] [phone]        Theotis Burrow, MD Follow up in 5 day(s).   Specialty: Family Medicine Contact information: [address] [place] [address] [phone]                Allergies  Allergen Reactions   Duloxetine Palpitations    Causes V-Tach-per Patient    Milnacipran Palpitations    Causes V-Tach per patient    Cymbalta [Duloxetine Hcl] Other (See Comments)    V-tach   Montelukast Hives   Tizanidine Hives   Savella [Milnacipran Hcl] Palpitations    Consultations: ID   Procedures/Studies: DG Tibia/Fibula Right  Result Date: 02/28/2021 CLINICAL DATA:  Infection, evaluate for soft tissue gas EXAM: RIGHT TIBIA AND FIBULA - 2 VIEW COMPARISON:  None. FINDINGS: No fracture or dislocation of the right tibia or fibula. Very extensive soft tissue edema about the lower leg. No subcutaneous gas. IMPRESSION: 1. No fracture or dislocation of the right tibia or fibula. 2. Very extensive soft tissue edema about the lower leg. No subcutaneous gas. Electronically Signed   By: Delanna Ahmadi M.D.   On: 02/28/2021 12:09   US Venous Img Lower Bilateral (DVT)  Result Date: 02/28/2021 CLINICAL DATA:  Bilateral lower extremity pain and edema. History of smoking. Evaluate for DVT. EXAM: BILATERAL LOWER EXTREMITY VENOUS DOPPLER ULTRASOUND TECHNIQUE: Gray-scale sonography with graded compression, as well as color Doppler and duplex ultrasound were performed to evaluate the lower extremity deep venous systems from the level of the common femoral vein and including the common femoral, femoral, profunda femoral, popliteal and calf veins including the posterior tibial, peroneal and gastrocnemius veins when visible. The superficial great saphenous vein was also interrogated. Spectral Doppler was utilized to evaluate flow at rest and with distal augmentation maneuvers in the common femoral, femoral and popliteal veins. COMPARISON:  None. FINDINGS: Examination  is degraded due to patient body habitus and poor sonographic window. RIGHT LOWER EXTREMITY Common Femoral Vein: No evidence of thrombus. Normal compressibility, respiratory phasicity and response to augmentation. Saphenofemoral Junction: No evidence of thrombus. Normal compressibility and flow on color Doppler imaging. Profunda Femoral Vein: No evidence of thrombus. Normal compressibility and flow on color Doppler imaging. Femoral Vein: No evidence of thrombus. Normal compressibility, respiratory phasicity and response to augmentation. Popliteal Vein: No evidence of thrombus. Normal compressibility, respiratory phasicity and response to augmentation. Calf Veins: Not well visualized. Superficial Great Saphenous Vein: No evidence of thrombus. Normal compressibility. Venous Reflux:  None. Other Findings:  None. LEFT LOWER EXTREMITY Common Femoral Vein: No evidence of thrombus. Normal compressibility, respiratory phasicity and response to augmentation. Saphenofemoral Junction: No evidence of thrombus. Normal compressibility and flow on color Doppler imaging. Profunda Femoral Vein: No evidence of thrombus. Normal compressibility and flow on color Doppler imaging. Femoral Vein: No evidence of thrombus. Normal compressibility, respiratory phasicity and response to augmentation. Popliteal Vein: No evidence of thrombus. Normal compressibility, respiratory phasicity and response to augmentation. Calf Veins: Not well visualized Superficial Great Saphenous Vein: No evidence of thrombus. Normal compressibility. Venous Reflux:  None. Other Findings:  None. IMPRESSION: No evidence of DVT within either lower extremity. Electronically Signed   By: Sandi Mariscal M.D.   On: 02/28/2021 14:16   ECHOCARDIOGRAM COMPLETE  Result Date: 03/01/2021    ECHOCARDIOGRAM REPORT   Patient Name:  Augusta Date of Exam: 03/01/2021 Medical Rec #:  [id number]             Height:       66.0 in Accession #:    [id number]            Weight:        361.0 lb Date of Birth:  Feb 06, 1970            BSA:          2.570 m Patient Age:    51 years              BP:           138/71 mmHg Patient Gender: F                     HR:           81 bpm. Exam Location:  [facility] Procedure: 2D Echo, Color Doppler and Cardiac Doppler Indications:     I50.31 congestive heart failure-Acute Diastolic  History:         Patient has prior history of Echocardiogram examinations, most                  recent 11/08/2019. CM, COPD; Risk Factors:Hypertension.  Sonographer:     Charmayne Sheer Referring Phys:  [id number] Lequita Halt Diagnosing Phys: Donnelly Angelica  Sonographer Comments: No subcostal window. Image acquisition challenging due to patient body habitus and Image acquisition challenging due to COPD. IMPRESSIONS  1. Left ventricular ejection fraction, by estimation, is 60 to 65%. The left ventricle has normal function. The left ventricle has no regional wall motion abnormalities. There is mild left ventricular hypertrophy. Left ventricular diastolic parameters are consistent with Grade I diastolic dysfunction (impaired relaxation).  2. Right ventricular systolic function is normal. The right ventricular size is normal.  3. The mitral valve is normal in structure. No evidence of mitral valve regurgitation. No evidence of mitral stenosis.  4. The aortic valve is normal in structure. Aortic valve regurgitation is not visualized. No aortic stenosis is present.  5. The inferior vena cava is normal in size with greater than 50% respiratory variability, suggesting right atrial pressure of 3 mmHg. FINDINGS  Left Ventricle: Left ventricular ejection fraction, by estimation, is 60 to 65%. The left ventricle has normal function. The left ventricle has no regional wall motion abnormalities. The left ventricular internal cavity size was normal in size. There is  mild left ventricular hypertrophy. Left ventricular diastolic parameters are consistent with Grade I diastolic dysfunction (impaired  relaxation). Right Ventricle: The right ventricular size is normal. No increase in right ventricular wall thickness. Right ventricular systolic function is normal. Left Atrium: Left atrial size was normal in size. Right Atrium: Right atrial size was normal in size. Pericardium: There is no evidence of pericardial effusion. Mitral Valve: The mitral valve is normal in structure. Mild mitral annular calcification. No evidence of mitral valve regurgitation. No evidence of mitral valve stenosis. MV peak gradient, 5.4 mmHg. The mean mitral valve gradient is 3.0 mmHg. Tricuspid Valve: The tricuspid valve is normal in structure. Tricuspid valve regurgitation is not demonstrated. No evidence of tricuspid stenosis. Aortic Valve: The aortic valve is normal in structure. Aortic valve regurgitation is not visualized. No aortic stenosis is present. Aortic valve mean gradient measures 9.0 mmHg. Aortic valve peak gradient measures 16.2 mmHg. Aortic valve area, by VTI measures 2.76 cm. Pulmonic Valve: The pulmonic valve was normal in structure.  Pulmonic valve regurgitation is not visualized. No evidence of pulmonic stenosis. Aorta: The aortic root is normal in size and structure. Venous: The inferior vena cava is normal in size with greater than 50% respiratory variability, suggesting right atrial pressure of 3 mmHg. IAS/Shunts: No atrial level shunt detected by color flow Doppler.  LEFT VENTRICLE PLAX 2D LVIDd:         5.26 cm   Diastology LVIDs:         3.13 cm   LV e' medial:    8.38 cm/s LV PW:         1.23 cm   LV E/e' medial:  9.8 LV IVS:        0.99 cm   LV e' lateral:   13.60 cm/s LVOT diam:     2.10 cm   LV E/e' lateral: 6.1 LV SV:         96 LV SV Index:   37 LVOT Area:     3.46 cm  RIGHT VENTRICLE RV Basal diam:  3.16 cm LEFT ATRIUM             Index        RIGHT ATRIUM           Index LA diam:        4.30 cm 1.67 cm/m   RA Area:     18.50 cm LA Vol (A2C):   33.5 ml 13.03 ml/m  RA Volume:   50.80 ml  19.76 ml/m LA  Vol (A4C):   24.4 ml 9.49 ml/m LA Biplane Vol: 28.8 ml 11.20 ml/m  AORTIC VALVE                     PULMONIC VALVE AV Area (Vmax):    2.34 cm      PV Vmax:       1.22 m/s AV Area (Vmean):   2.59 cm      PV Vmean:      91.100 cm/s AV Area (VTI):     2.76 cm      PV VTI:        0.214 m AV Vmax:           201.00 cm/s   PV Peak grad:  6.0 mmHg AV Vmean:          142.000 cm/s  PV Mean grad:  4.0 mmHg AV VTI:            0.346 m AV Peak Grad:      16.2 mmHg AV Mean Grad:      9.0 mmHg LVOT Vmax:         136.00 cm/s LVOT Vmean:        106.000 cm/s LVOT VTI:          0.276 m LVOT/AV VTI ratio: 0.80  AORTA Ao Root diam: 3.20 cm MITRAL VALVE MV Area (PHT): 5.88 cm    SHUNTS MV Area VTI:   3.77 cm    Systemic VTI:  0.28 m MV Peak grad:  5.4 mmHg    Systemic Diam: 2.10 cm MV Mean grad:  3.0 mmHg MV Vmax:       1.16 m/s MV Vmean:      78.5 cm/s MV Decel Time: 129 msec MV E velocity: 82.30 cm/s MV A velocity: 63.80 cm/s MV E/A ratio:  1.29 Donnelly Angelica Electronically signed by Donnelly Angelica Signature Date/Time: 03/01/2021/1:52:59 PM    Final    (Echo, Carotid, EGD, Colonoscopy, ERCP)  Subjective: Pt c/o fatigue    Discharge Exam: Vitals:   03/06/21 0753 03/06/21 1152  BP: (!) 127/92 116/79  Pulse: 82 85  Resp: 17 15  Temp: 98.3 F (36.8 C) 99.1 F (37.3 C)  SpO2: 94% 91%   Vitals:   03/06/21 0500 03/06/21 0629 03/06/21 0753 03/06/21 1152  BP:  137/69 (!) 127/92 116/79  Pulse:  94 82 85  Resp:   17 15  Temp:  98.5 F (36.9 C) 98.3 F (36.8 C) 99.1 F (37.3 C)  TempSrc:   Oral Oral  SpO2:  92% 94% 91%  Weight: (!) 173.3 kg     Height:        General: Pt is alert, awake, not in acute distress Cardiovascular:S1/S2 +, no rubs, no gallops Respiratory: CTA bilaterally, no wheezing, no rhonchi Abdominal: Soft, NT, obese, bowel sounds + Extremities: b/l LE edema, no cyanosis    The results of significant diagnostics from this hospitalization (including imaging, microbiology, ancillary and  laboratory) are listed below for reference.     Microbiology: Recent Results (from the past 240 hour(s))  Culture, blood (Routine X 2) w Reflex to ID Panel     Status: None (Preliminary result)   Collection Time: 02/28/21 10:40 AM   Specimen: BLOOD  Result Value Ref Range Status   Specimen Description BLOOD RIGHT HAND  Final   Special Requests   Final    BOTTLES DRAWN AEROBIC AND ANAEROBIC Blood Culture results may not be optimal due to an inadequate volume of blood received in culture bottles   Culture   Final    NO GROWTH 4 DAYS Performed at [facility], [address], [place], [place] [address]    Report Status PENDING  Incomplete  Culture, blood (Routine X 2) w Reflex to ID Panel     Status: None (Preliminary result)   Collection Time: 02/28/21 11:40 AM   Specimen: BLOOD  Result Value Ref Range Status   Specimen Description BLOOD RIGHT HAND  Final   Special Requests   Final    BOTTLES DRAWN AEROBIC AND ANAEROBIC Blood Culture results may not be optimal due to an inadequate volume of blood received in culture bottles   Culture   Final    NO GROWTH 4 DAYS Performed at [facility], [address] [place], [place] [address]    Report Status PENDING  Incomplete  Resp Panel by RT-PCR (Flu A&B, Covid) Nasopharyngeal Swab     Status: None   Collection Time: 02/28/21  3:00 PM   Specimen: Nasopharyngeal Swab; Nasopharyngeal(NP) swabs in vial transport medium  Result Value Ref Range Status   SARS Coronavirus 2 by RT PCR NEGATIVE NEGATIVE Final    Comment: (NOTE) SARS-CoV-2 target nucleic acids are NOT DETECTED.  The SARS-CoV-2 RNA is generally detectable in upper respiratory specimens during the acute phase of infection. The lowest concentration of SARS-CoV-2 viral copies this assay can detect is 138 copies/mL. A negative result does not preclude SARS-Cov-2 infection and should not be used as the sole basis for treatment or other patient management  decisions. A negative result may occur with  improper specimen collection/handling, submission of specimen other than nasopharyngeal swab, presence of viral mutation(s) within the areas targeted by this assay, and inadequate number of viral copies(<138 copies/mL). A negative result must be combined with clinical observations, patient history, and epidemiological information. The expected result is Negative.  Fact Sheet for Patients:  EntrepreneurPulse.com.au  Fact Sheet for Healthcare Providers:  IncredibleEmployment.be  This test is no t yet approved or cleared by the [place] and  has been authorized for detection and/or diagnosis of SARS-CoV-2 by FDA under an Emergency Use Authorization (EUA). This EUA will remain  in effect (meaning this test can be used) for the duration of the COVID-19 declaration under Section 564(b)(1) of the Act, 21 U.S.C.section 360bbb-3(b)(1), unless the authorization is terminated  or revoked sooner.       Influenza A by PCR NEGATIVE NEGATIVE Final   Influenza B by PCR NEGATIVE NEGATIVE Final    Comment: (NOTE) The Xpert Xpress SARS-CoV-2/FLU/RSV plus assay is intended as an aid in the diagnosis of influenza from Nasopharyngeal swab specimens and should not be used as a sole basis for treatment. Nasal washings and aspirates are unacceptable for Xpert Xpress SARS-CoV-2/FLU/RSV testing.  Fact Sheet for Patients: EntrepreneurPulse.com.au  Fact Sheet for Healthcare Providers: IncredibleEmployment.be  This test is not yet approved or cleared by the [place] FDA and has been authorized for detection and/or diagnosis of SARS-CoV-2 by FDA under an Emergency Use Authorization (EUA). This EUA will remain in effect (meaning this test can be used) for the duration of the COVID-19 declaration under Section 564(b)(1) of the Act, 21 U.S.C. section 360bbb-3(b)(1), unless the  authorization is terminated or revoked.  Performed at [facility], [address] [place], [place] [address]   Aerobic/Anaerobic Culture w Gram Stain (surgical/deep wound)     Status: None (Preliminary result)   Collection Time: 03/01/21  2:39 PM   Specimen: Leg; Wound  Result Value Ref Range Status   Specimen Description   Final    LEG RIGHT Performed at [facility], [address], [place], [place] [address]    Special Requests   Final    Normal Performed at [facility], [address] [place], [place] [address]    Gram Stain   Final    MODERATE WBC PRESENT,BOTH PMN AND MONONUCLEAR NO ORGANISMS SEEN Performed at [facility], [address] [place],  [address]    Culture   Final    RARE NORMAL SKIN FLORA NO STAPHYLOCOCCUS AUREUS ISOLATED NO GROUP A STREP (S.PYOGENES) ISOLATED NO ANAEROBES ISOLATED; CULTURE IN PROGRESS FOR 5 DAYS    Report Status PENDING  Incomplete     Labs: BNP (last 3 results) No results for input(s): BNP in the last 8760 hours. Basic Metabolic Panel: Recent Labs  Lab 02/28/21 1039 03/01/21 0703 03/04/21 0404 03/05/21 0339 03/06/21 0401  NA 137 134* 136 134* 132*  K 4.5 4.9 4.0 3.7 4.4  CL 104 102 97* 95* 94*  CO2 24 19* '27 31 28  ' GLUCOSE 136* 92 130* 126* 136*  BUN 18 19 22* 24* 24*  CREATININE 0.92 0.71 0.84 0.81 0.84  CALCIUM 9.0 9.2 9.6 9.4 9.7  MG  --   --  2.0 2.1  --    Liver Function Tests: Recent Labs  Lab 02/28/21 1858  AST 23  ALT 27  ALKPHOS 79  BILITOT 0.6  PROT 7.5  ALBUMIN 3.7   No results for input(s): LIPASE, AMYLASE in the last 168 hours. No results for input(s): AMMONIA in the last 168 hours. CBC: Recent Labs  Lab 02/28/21 1039 03/01/21 0911 03/06/21 0401  WBC 12.5* 10.9* 9.4  NEUTROABS 9.2*  --   --   HGB 13.7 13.4 14.9  HCT 43.9 42.4 47.4*  MCV 92.0 90.8 89.9  PLT 327 305 340   Cardiac Enzymes: Recent  Labs  Lab 02/28/21 1028  CKTOTAL 70    BNP: Invalid input(s): POCBNP CBG: Recent Labs  Lab 03/05/21 1114 03/05/21 1629 03/05/21 2104 03/06/21 0714 03/06/21 1115  GLUCAP 157* 115* 145* 122* 113*   D-Dimer No results for input(s): DDIMER in the last 72 hours. Hgb A1c No results for input(s): HGBA1C in the last 72 hours. Lipid Profile No results for input(s): CHOL, HDL, LDLCALC, TRIG, CHOLHDL, LDLDIRECT in the last 72 hours. Thyroid function studies No results for input(s): TSH, T4TOTAL, T3FREE, THYROIDAB in the last 72 hours.  Invalid input(s): FREET3 Anemia work up No results for input(s): VITAMINB12, FOLATE, FERRITIN, TIBC, IRON, RETICCTPCT in the last 72 hours. Urinalysis    Component Value Date/Time   COLORURINE STRAW (A) 11/03/2019 2003   APPEARANCEUR HAZY (A) 11/03/2019 2003   LABSPEC 1.027 11/03/2019 2003   PHURINE 5.0 11/03/2019 2003   GLUCOSEU >=500 (A) 11/03/2019 2003   HGBUR MODERATE (A) 11/03/2019 2003   BILIRUBINUR NEGATIVE 11/03/2019 2003   KETONESUR 5 (A) 11/03/2019 2003   PROTEINUR NEGATIVE 11/03/2019 2003   NITRITE NEGATIVE 11/03/2019 2003   LEUKOCYTESUR SMALL (A) 11/03/2019 2003   Sepsis Labs Invalid input(s): PROCALCITONIN,  WBC,  LACTICIDVEN Microbiology Recent Results (from the past 240 hour(s))  Culture, blood (Routine X 2) w Reflex to ID Panel     Status: None (Preliminary result)   Collection Time: 02/28/21 10:40 AM   Specimen: BLOOD  Result Value Ref Range Status   Specimen Description BLOOD RIGHT HAND  Final   Special Requests   Final    BOTTLES DRAWN AEROBIC AND ANAEROBIC Blood Culture results may not be optimal due to an inadequate volume of blood received in culture bottles   Culture   Final    NO GROWTH 4 DAYS Performed at [facility], [address] [place], [place] [address]    Report Status PENDING  Incomplete  Culture, blood (Routine X 2) w Reflex to ID Panel     Status: None (Preliminary result)   Collection Time: 02/28/21 11:40 AM   Specimen: BLOOD   Result Value Ref Range Status   Specimen Description BLOOD RIGHT HAND  Final   Special Requests   Final    BOTTLES DRAWN AEROBIC AND ANAEROBIC Blood Culture results may not be optimal due to an inadequate volume of blood received in culture bottles   Culture   Final    NO GROWTH 4 DAYS Performed at [facility], [address], [place], [place] [address]    Report Status PENDING  Incomplete  Resp Panel by RT-PCR (Flu A&B, Covid) Nasopharyngeal Swab     Status: None   Collection Time: 02/28/21  3:00 PM   Specimen: Nasopharyngeal Swab; Nasopharyngeal(NP) swabs in vial transport medium  Result Value Ref Range Status   SARS Coronavirus 2 by RT PCR NEGATIVE NEGATIVE Final    Comment: (NOTE) SARS-CoV-2 target nucleic acids are NOT DETECTED.  The SARS-CoV-2 RNA is generally detectable in upper respiratory specimens during the acute phase of infection. The lowest concentration of SARS-CoV-2 viral copies this assay can detect is 138 copies/mL. A negative result does not preclude SARS-Cov-2 infection and should not be used as the sole basis for treatment or other patient management decisions. A negative result may occur with  improper specimen collection/handling, submission of specimen other than nasopharyngeal swab, presence of viral mutation(s) within the areas targeted by this assay, and inadequate number of viral copies(<138 copies/mL). A negative result must be combined with clinical  observations, patient history, and epidemiological information. The expected result is Negative.  Fact Sheet for Patients:  EntrepreneurPulse.com.au  Fact Sheet for Healthcare Providers:  IncredibleEmployment.be  This test is no t yet approved or cleared by the [place] FDA and  has been authorized for detection and/or diagnosis of SARS-CoV-2 by FDA under an Emergency Use Authorization (EUA). This EUA will remain  in effect (meaning this test can be  used) for the duration of the COVID-19 declaration under Section 564(b)(1) of the Act, 21 U.S.C.section 360bbb-3(b)(1), unless the authorization is terminated  or revoked sooner.       Influenza A by PCR NEGATIVE NEGATIVE Final   Influenza B by PCR NEGATIVE NEGATIVE Final    Comment: (NOTE) The Xpert Xpress SARS-CoV-2/FLU/RSV plus assay is intended as an aid in the diagnosis of influenza from Nasopharyngeal swab specimens and should not be used as a sole basis for treatment. Nasal washings and aspirates are unacceptable for Xpert Xpress SARS-CoV-2/FLU/RSV testing.  Fact Sheet for Patients: EntrepreneurPulse.com.au  Fact Sheet for Healthcare Providers: IncredibleEmployment.be  This test is not yet approved or cleared by the [place] FDA and has been authorized for detection and/or diagnosis of SARS-CoV-2 by FDA under an Emergency Use Authorization (EUA). This EUA will remain in effect (meaning this test can be used) for the duration of the COVID-19 declaration under Section 564(b)(1) of the Act, 21 U.S.C. section 360bbb-3(b)(1), unless the authorization is terminated or revoked.  Performed at [facility], [address] [place], [place] [address]   Aerobic/Anaerobic Culture w Gram Stain (surgical/deep wound)     Status: None (Preliminary result)   Collection Time: 03/01/21  2:39 PM   Specimen: Leg; Wound  Result Value Ref Range Status   Specimen Description   Final    LEG RIGHT Performed at [facility], [address], [place], [place] [address]    Special Requests   Final    Normal Performed at [facility], [address] [place], [place] [address]    Gram Stain   Final    MODERATE WBC PRESENT,BOTH PMN AND MONONUCLEAR NO ORGANISMS SEEN Performed at [facility], [place] [address], [place],  [address]    Culture   Final    RARE NORMAL SKIN FLORA NO STAPHYLOCOCCUS AUREUS ISOLATED NO  GROUP A STREP (S.PYOGENES) ISOLATED NO ANAEROBES ISOLATED; CULTURE IN PROGRESS FOR 5 DAYS    Report Status PENDING  Incomplete     Time coordinating discharge: Over 30 minutes  SIGNED:   Wyvonnia Dusky, MD  [facility] 03/06/2021, 1:05 PM Pager   If 7PM-7AM, please contact night-coverage

## 2021-03-11 LAB — CULTURE, BLOOD (ROUTINE X 2): Culture: NO GROWTH

## 2021-03-12 LAB — CULTURE, BLOOD (ROUTINE X 2): Culture: NO GROWTH

## 2021-04-24 ENCOUNTER — Ambulatory Visit: Payer: Medicaid Other | Admitting: Internal Medicine

## 2021-05-08 ENCOUNTER — Ambulatory Visit: Payer: Medicaid Other | Admitting: Internal Medicine

## 2021-05-24 ENCOUNTER — Ambulatory Visit: Payer: Medicaid Other | Admitting: Physician Assistant

## 2021-06-04 ENCOUNTER — Ambulatory Visit: Payer: Medicaid Other | Admitting: Physician Assistant

## 2021-06-27 DEATH — deceased

## 2022-07-09 IMAGING — US US EXTREM LOW VENOUS
1 series · 13 of 24 positions shown · non-contrast
Comparison: None.

CLINICAL DATA: Bilateral lower extremity pain and edema. History of
smoking. Evaluate for DVT.



[Series 1: us venous img lower bilat (dvt) · portal-venous · 13 of 57 slices shown]
[im 1/57]
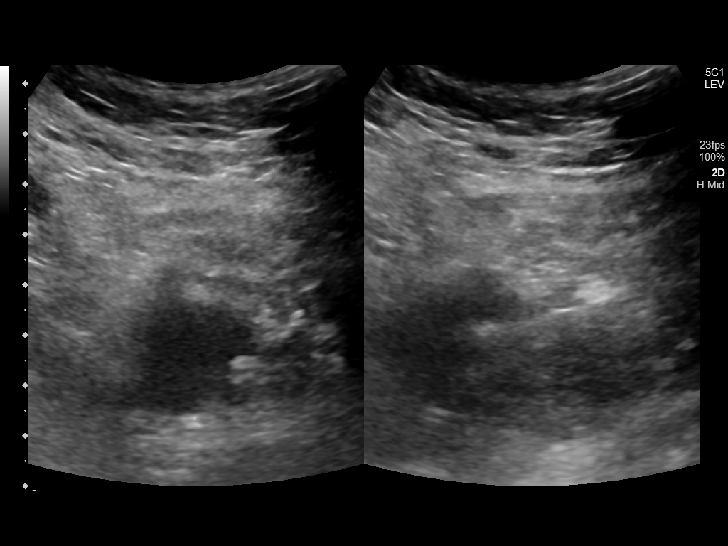
[im 5/57]
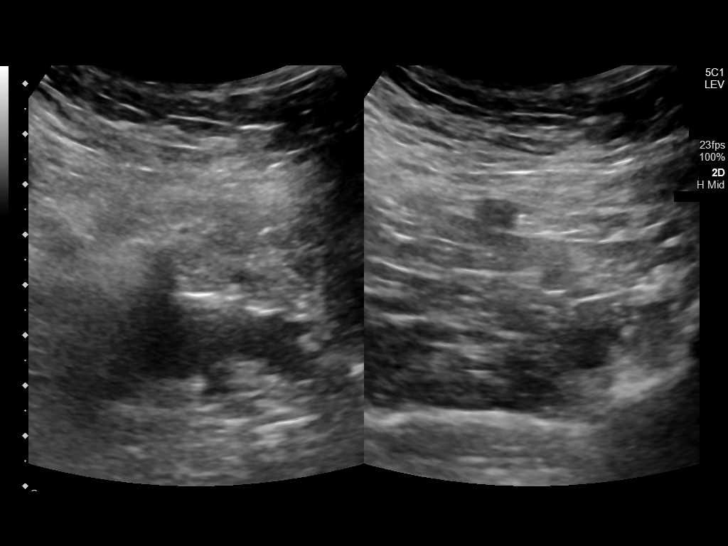
[im 10/57]
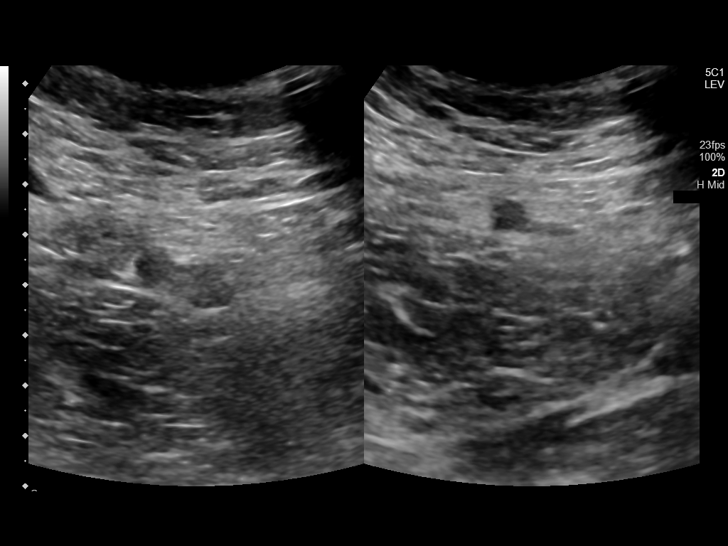
[im 15/57]
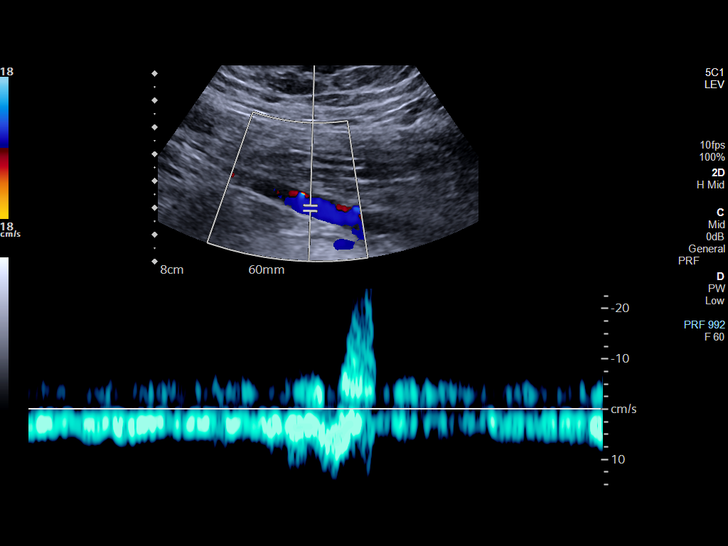
[im 20/57]
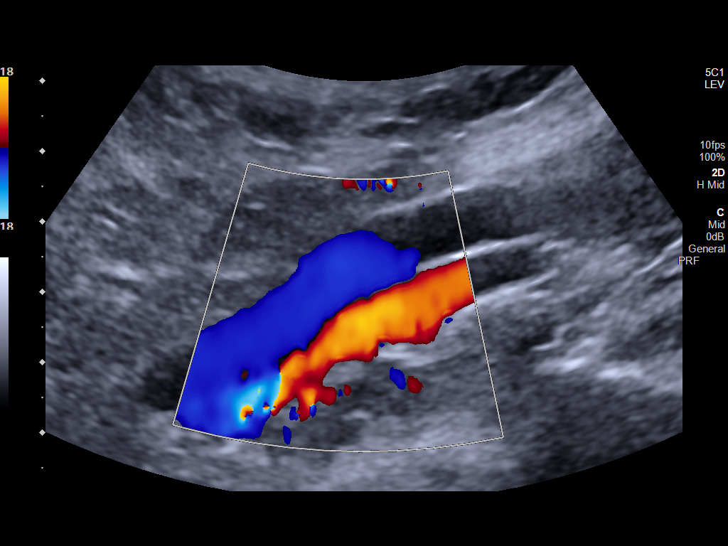
[im 25/57]
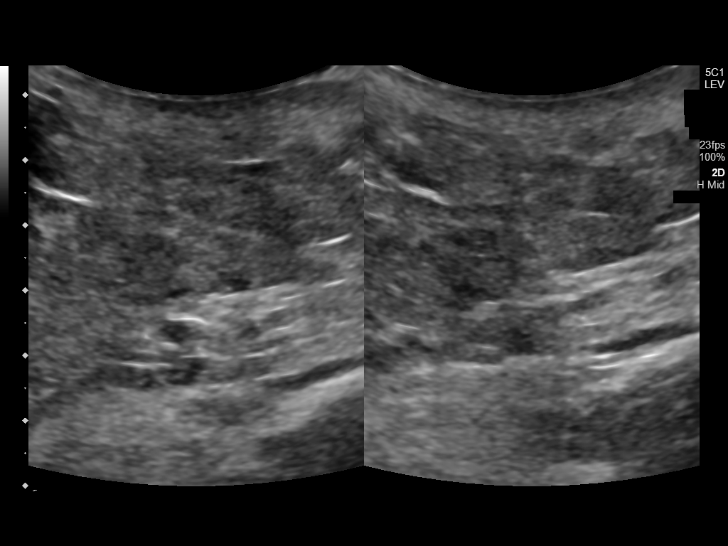
[im 30/57]
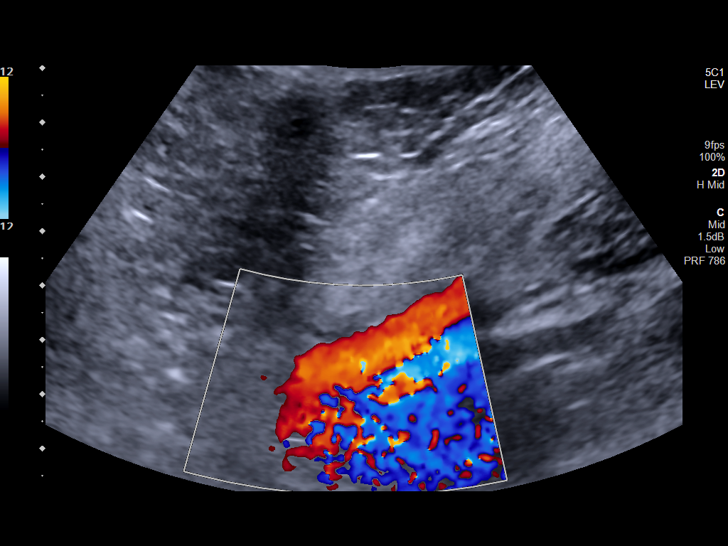
[im 32/57]
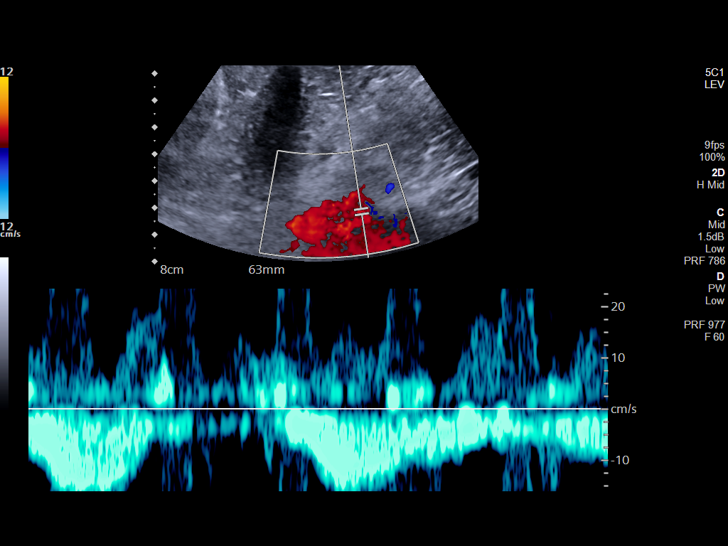
[im 37/57]
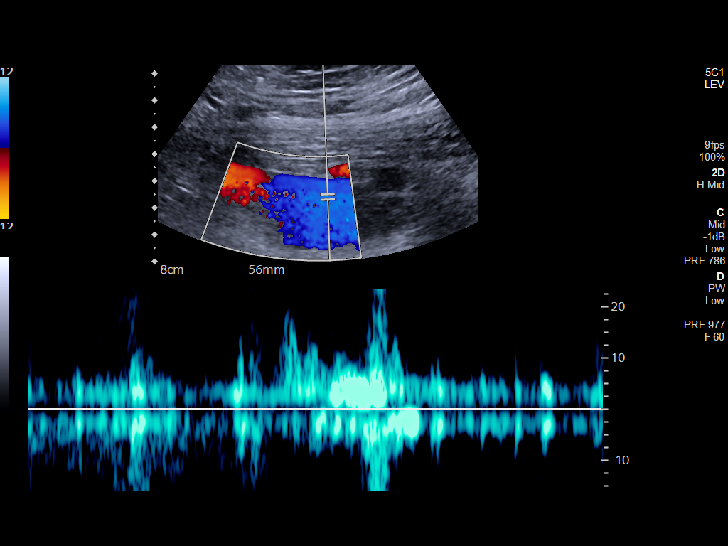
[im 42/57]
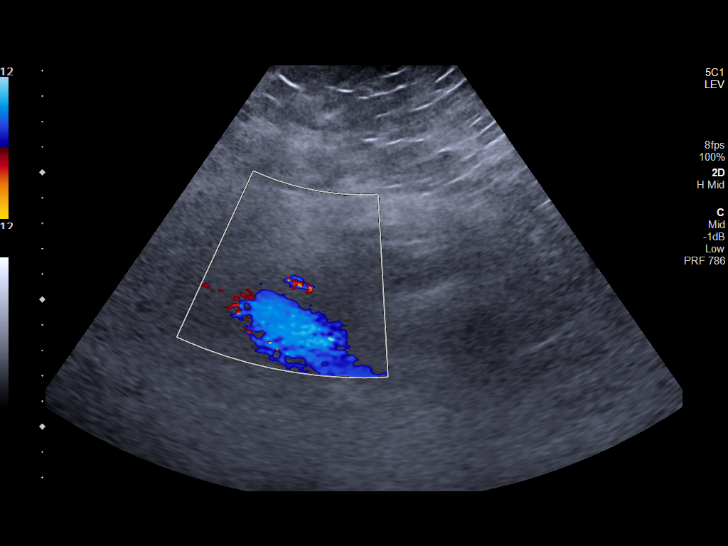
[im 47/57]
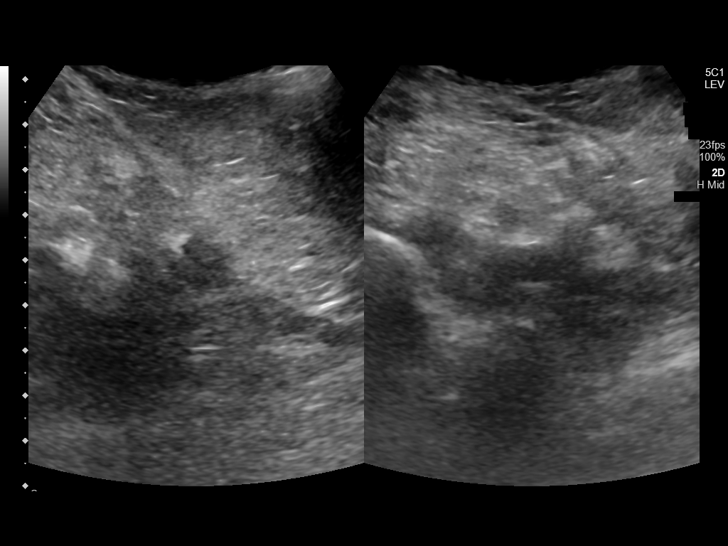
[im 52/57]
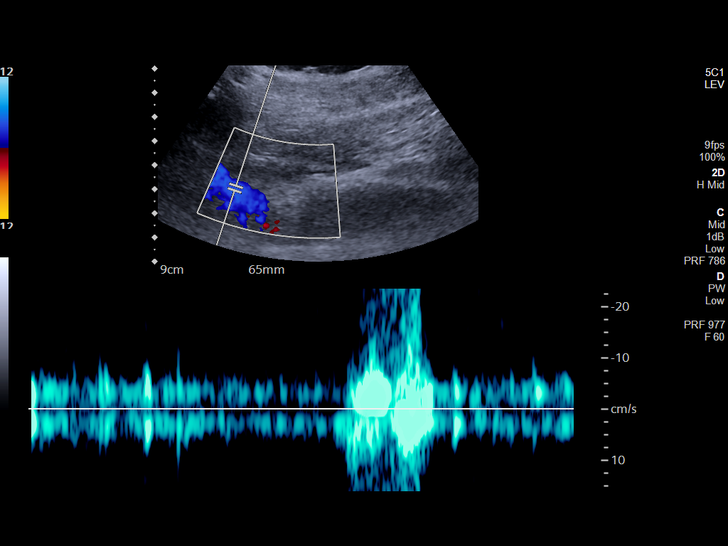
[im 57/57]
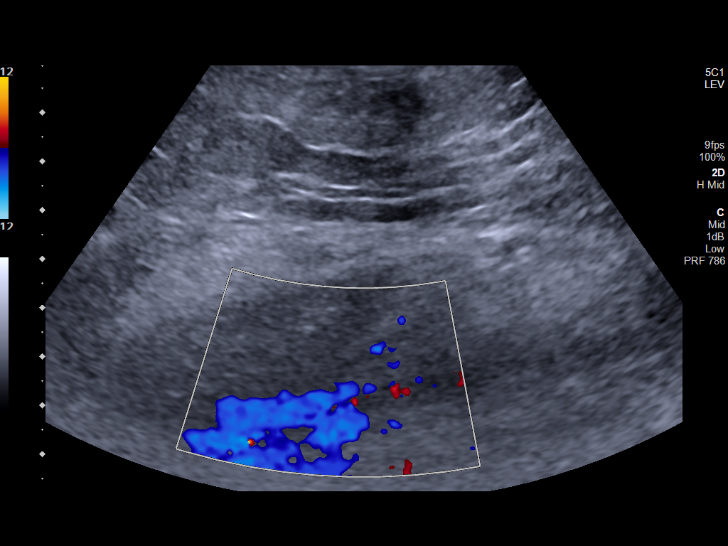

[13 of 24 positions shown; findings below may reference images not displayed]

FINDINGS: Examination is degraded due to patient body habitus and poor
sonographic window.

RIGHT LOWER EXTREMITY

Common Femoral Vein: No evidence of thrombus. Normal
compressibility, respiratory phasicity and response to augmentation.

Saphenofemoral Junction: No evidence of thrombus. Normal
compressibility and flow on color Doppler imaging.

Profunda Femoral Vein: No evidence of thrombus. Normal
compressibility and flow on color Doppler imaging.

Femoral Vein: No evidence of thrombus. Normal compressibility,
respiratory phasicity and response to augmentation.

Popliteal Vein: No evidence of thrombus. Normal compressibility,
respiratory phasicity and response to augmentation.

Calf Veins: Not well visualized.

Superficial Great Saphenous Vein: No evidence of thrombus. Normal
compressibility.

Venous Reflux:  None.

Other Findings:  None.

LEFT LOWER EXTREMITY

Common Femoral Vein: No evidence of thrombus. Normal
compressibility, respiratory phasicity and response to augmentation.

Saphenofemoral Junction: No evidence of thrombus. Normal
compressibility and flow on color Doppler imaging.

Profunda Femoral Vein: No evidence of thrombus. Normal
compressibility and flow on color Doppler imaging.

Femoral Vein: No evidence of thrombus. Normal compressibility,
respiratory phasicity and response to augmentation.

Popliteal Vein: No evidence of thrombus. Normal compressibility,
respiratory phasicity and response to augmentation.

Calf Veins: Not well visualized

Superficial Great Saphenous Vein: No evidence of thrombus. Normal
compressibility.

Venous Reflux:  None.

Other Findings:  None.
IMPRESSION: No evidence of DVT within either lower extremity.
